# Patient Record
Sex: Male | Born: 1980 | Race: White | Hispanic: No | Marital: Single | Smoking: Current some day smoker
Health system: Southern US, Community
[De-identification: ages and names within clinical notes are randomized; demographics above are authoritative.]

## PROBLEM LIST (undated history)

## (undated) DIAGNOSIS — Z720 Tobacco use: Secondary | ICD-10-CM

## (undated) DIAGNOSIS — L0390 Cellulitis, unspecified: Secondary | ICD-10-CM

## (undated) DIAGNOSIS — F1990 Other psychoactive substance use, unspecified, uncomplicated: Secondary | ICD-10-CM

## (undated) HISTORY — PX: SHOULDER SURGERY: SHX246

---

## 2017-01-26 ENCOUNTER — Emergency Department (HOSPITAL_COMMUNITY): Payer: Self-pay

## 2017-01-26 ENCOUNTER — Encounter (HOSPITAL_COMMUNITY): Payer: Self-pay | Admitting: Emergency Medicine

## 2017-01-26 ENCOUNTER — Inpatient Hospital Stay (HOSPITAL_COMMUNITY)
Admission: EM | Admit: 2017-01-26 | Discharge: 2017-01-27 | DRG: 603 | Discharge status: Against Medical Advice | Payer: Self-pay | Attending: Internal Medicine | Admitting: Internal Medicine

## 2017-01-26 DIAGNOSIS — F1990 Other psychoactive substance use, unspecified, uncomplicated: Secondary | ICD-10-CM | POA: Diagnosis present

## 2017-01-26 DIAGNOSIS — F1910 Other psychoactive substance abuse, uncomplicated: Secondary | ICD-10-CM | POA: Diagnosis present

## 2017-01-26 DIAGNOSIS — F17210 Nicotine dependence, cigarettes, uncomplicated: Secondary | ICD-10-CM | POA: Diagnosis present

## 2017-01-26 DIAGNOSIS — L0390 Cellulitis, unspecified: Secondary | ICD-10-CM | POA: Diagnosis present

## 2017-01-26 DIAGNOSIS — L03012 Cellulitis of left finger: Secondary | ICD-10-CM

## 2017-01-26 DIAGNOSIS — Z720 Tobacco use: Secondary | ICD-10-CM | POA: Diagnosis present

## 2017-01-26 DIAGNOSIS — L03114 Cellulitis of left upper limb: Principal | ICD-10-CM | POA: Diagnosis present

## 2017-01-26 HISTORY — DX: Other psychoactive substance use, unspecified, uncomplicated: F19.90

## 2017-01-26 HISTORY — DX: Tobacco use: Z72.0

## 2017-01-26 HISTORY — DX: Cellulitis, unspecified: L03.90

## 2017-01-26 LAB — BASIC METABOLIC PANEL
Anion gap: 13 (ref 5–15)
BUN: 12 mg/dL (ref 6–20)
CALCIUM: 9.3 mg/dL (ref 8.9–10.3)
CO2: 24 mmol/L (ref 22–32)
CREATININE: 1.01 mg/dL (ref 0.61–1.24)
Chloride: 99 mmol/L — ABNORMAL LOW (ref 101–111)
GFR calc Af Amer: >60 mL/min (ref >60)
GLUCOSE: 84 mg/dL (ref 65–99)
POTASSIUM: 4.1 mmol/L (ref 3.5–5.1)
SODIUM: 136 mmol/L (ref 135–145)

## 2017-01-26 LAB — CBC WITH DIFFERENTIAL/PLATELET
Basophils Absolute: 0 10*3/uL (ref 0.0–0.1)
Basophils Relative: 0 %
EOS ABS: 0 10*3/uL (ref 0.0–0.7)
EOS PCT: 0 %
HCT: 39 % (ref 39.0–52.0)
Hemoglobin: 13.1 g/dL (ref 13.0–17.0)
LYMPHS ABS: 2.1 10*3/uL (ref 0.7–4.0)
Lymphocytes Relative: 32 %
MCH: 29.6 pg (ref 26.0–34.0)
MCHC: 33.6 g/dL (ref 30.0–36.0)
MCV: 88.2 fL (ref 78.0–100.0)
MONO ABS: 0.5 10*3/uL (ref 0.1–1.0)
Monocytes Relative: 7 %
Neutro Abs: 3.9 10*3/uL (ref 1.7–7.7)
Neutrophils Relative %: 61 %
PLATELETS: 153 10*3/uL (ref 150–400)
RBC: 4.42 MIL/uL (ref 4.22–5.81)
RDW: 13.4 % (ref 11.5–15.5)
WBC: 6.5 10*3/uL (ref 4.0–10.5)

## 2017-01-26 LAB — RAPID URINE DRUG SCREEN, HOSP PERFORMED
AMPHETAMINES: POSITIVE — AB
Barbiturates: NOT DETECTED
Benzodiazepines: NOT DETECTED
COCAINE: NOT DETECTED
OPIATES: POSITIVE — AB
Tetrahydrocannabinol: NOT DETECTED

## 2017-01-26 LAB — LACTIC ACID, PLASMA: LACTIC ACID, VENOUS: 1 mmol/L (ref 0.5–1.9)

## 2017-01-26 LAB — HIV ANTIBODY (ROUTINE TESTING W REFLEX): HIV SCREEN 4TH GENERATION: NONREACTIVE

## 2017-01-26 MED ORDER — CEFTRIAXONE SODIUM 1 G IJ SOLR
1.0000 g | INTRAMUSCULAR | Status: DC
  Administered 2017-01-26 – 2017-01-27 (×2): 1 g via INTRAVENOUS
  Filled 2017-01-26 (×3): qty 10

## 2017-01-26 MED ORDER — SODIUM CHLORIDE 0.9 % IV SOLN
INTRAVENOUS | Status: AC
  Administered 2017-01-26: 11:00:00 via INTRAVENOUS

## 2017-01-26 MED ORDER — ACETAMINOPHEN 325 MG PO TABS: 650.0000 mg | ORAL_TABLET | Freq: Four times a day (QID) | ORAL | Status: DC | PRN

## 2017-01-26 MED ORDER — SODIUM CHLORIDE 0.9 % IV BOLUS (SEPSIS)
2000.0000 mL | Freq: Once | INTRAVENOUS | Status: AC
  Administered 2017-01-26: 2000 mL via INTRAVENOUS

## 2017-01-26 MED ORDER — CEFAZOLIN IN D5W 1 GM/50ML IV SOLN: 1.0000 g | Freq: Three times a day (TID) | INTRAVENOUS | Status: DC

## 2017-01-26 MED ORDER — ONDANSETRON HCL 4 MG/2ML IJ SOLN
4.0000 mg | Freq: Once | INTRAMUSCULAR | Status: AC
  Administered 2017-01-26: 4 mg via INTRAVENOUS
  Filled 2017-01-26: qty 2

## 2017-01-26 MED ORDER — NICOTINE 21 MG/24HR TD PT24
21.0000 mg | MEDICATED_PATCH | Freq: Every day | TRANSDERMAL | Status: DC
  Administered 2017-01-26 – 2017-01-27 (×2): 21 mg via TRANSDERMAL
  Filled 2017-01-26 (×2): qty 1

## 2017-01-26 MED ORDER — SENNOSIDES-DOCUSATE SODIUM 8.6-50 MG PO TABS: 1.0000 | ORAL_TABLET | Freq: Every evening | ORAL | Status: DC | PRN

## 2017-01-26 MED ORDER — ONDANSETRON HCL 4 MG PO TABS: 4.0000 mg | ORAL_TABLET | Freq: Four times a day (QID) | ORAL | Status: DC | PRN

## 2017-01-26 MED ORDER — ENOXAPARIN SODIUM 40 MG/0.4ML ~~LOC~~ SOLN
40.0000 mg | Freq: Every day | SUBCUTANEOUS | Status: DC
  Administered 2017-01-26 – 2017-01-27 (×2): 40 mg via SUBCUTANEOUS
  Filled 2017-01-26 (×2): qty 0.4

## 2017-01-26 MED ORDER — OXYCODONE-ACETAMINOPHEN 5-325 MG PO TABS
1.0000 | ORAL_TABLET | ORAL | Status: DC | PRN
  Administered 2017-01-26 – 2017-01-27 (×6): 1 via ORAL
  Filled 2017-01-26 (×6): qty 1

## 2017-01-26 MED ORDER — MORPHINE SULFATE (PF) 4 MG/ML IV SOLN
4.0000 mg | Freq: Once | INTRAVENOUS | Status: AC
  Administered 2017-01-26: 4 mg via INTRAVENOUS
  Filled 2017-01-26: qty 1

## 2017-01-26 MED ORDER — MORPHINE SULFATE (PF) 4 MG/ML IV SOLN
1.0000 mg | INTRAVENOUS | Status: DC | PRN
Start: 2017-01-26 — End: 2017-01-27
  Administered 2017-01-26 – 2017-01-27 (×6): 1 mg via INTRAVENOUS
  Filled 2017-01-26 (×6): qty 1

## 2017-01-26 MED ORDER — SODIUM CHLORIDE 0.9 % IV SOLN: INTRAVENOUS | Status: DC

## 2017-01-26 MED ORDER — VANCOMYCIN HCL IN DEXTROSE 1-5 GM/200ML-% IV SOLN
1000.0000 mg | Freq: Once | INTRAVENOUS | Status: AC
  Administered 2017-01-26: 1000 mg via INTRAVENOUS
  Filled 2017-01-26: qty 200

## 2017-01-26 MED ORDER — ACETAMINOPHEN 650 MG RE SUPP: 650.0000 mg | Freq: Four times a day (QID) | RECTAL | Status: DC | PRN

## 2017-01-26 MED ORDER — ONDANSETRON HCL 4 MG/2ML IJ SOLN: 4.0000 mg | Freq: Four times a day (QID) | INTRAMUSCULAR | Status: DC | PRN

## 2017-01-26 NOTE — Progress Notes (Signed)
Luke Ellis is a 36 y.o. male patient admitted from ED awake, alert - oriented  X 4 - no acute distress noted.  VSS - Blood pressure 126/75, pulse 74, temperature 98.9 F (37.2 C), temperature source Oral, resp. rate 16, height 6' (1.829 m), weight 79.9 kg (176 lb 1.6 oz), SpO2 99 %.    IV in place, occlusive dsg intact without redness.  Orientation to room, and floor completed with information packet given to patient/family.  Patient declined safety video at this time.  Admission INP armband ID verified with patient/family, and in place.   SR up x 2, fall assessment complete, with patient and family able to verbalize understanding of risk associated with falls, and verbalized understanding to call nsg before up out of bed.  Call light within reach, patient able to voice, and demonstrate understanding.  Skin, clean-dry- intact without evidence of bruising, or skin tears.   No evidence of skin break down noted on exam.     Will cont to eval and treat per MD orders.  Luke EastErin M Jojo Geving, RN 01/26/2017 4:24 PM

## 2017-01-26 NOTE — ED Notes (Signed)
IV team at bedside 

## 2017-01-26 NOTE — ED Provider Notes (Signed)
[facility]-EMERGENCY DEPT Provider Note   CSN: [id number] Arrival date & time: 01/26/17  0400     History   Chief Complaint Chief Complaint  Patient presents with  . Hand Pain    HPI Luke Ellis is a 36 y.o. male.  HPI  This is a 36 year old male with a history of IV drug abuse who presents with left hand redness, pain, and swelling. Patient reports that 2-3 days ago he accidentally stuck himself with a used needle in the palmar aspect of the left hand. He states "I hit bone." He denies any needle sharing. Patient states that since that time he has had increasing pain, swelling, and redness to the left hand. He is also noted pain in the left middle digit. Current pain is 10 out of 10. He is not taking anything for pain. Patient states that he was seen at [facility] days ago" I have been on antibiotics but it has gotten worse." He has a prescription for Bactrim. He is right handed. Denies any fevers.  History reviewed. No pertinent past medical history.  There are no active problems to display for this patient.   Past Surgical History:  Procedure Laterality Date  . SHOULDER SURGERY Left        Home Medications    Prior to Admission medications   Medication Sig Start Date End Date Taking? Authorizing Provider  oxyCODONE (OXY IR/ROXICODONE) 5 MG immediate release tablet Take 5 mg by mouth every 6 (six) hours as needed for severe pain.   Yes Historical Provider, MD  sulfamethoxazole-trimethoprim (BACTRIM DS,SEPTRA DS) 800-160 MG tablet Take 1 tablet by mouth 2 (two) times daily.   Yes Historical Provider, MD    Family History History reviewed. No pertinent family history.  Social History Social History  Substance Use Topics  . Smoking status: Current Some Day Smoker    Packs/day: 1.00    Types: Cigarettes  . Smokeless tobacco: Former NeurosurgeonUser  . Alcohol use No     Allergies   Patient has no known allergies.   Review of Systems Review of Systems    Constitutional: Negative for fever.  Respiratory: Negative for shortness of breath.   Cardiovascular: Negative for chest pain.  Musculoskeletal:       Left hand pain, swelling, erythema  Skin: Positive for color change.  All other systems reviewed and are negative.    Physical Exam Updated Vital Signs BP (!) 138/92 (BP Location: Right Arm)   Pulse 76   Temp 97.8 F (36.6 C) (Oral)   Resp 16   Ht 6' (1.829 m)   Wt 180 lb (81.6 kg)   SpO2 100%   BMI 24.41 kg/m   Physical Exam  Constitutional: He is oriented to person, place, and time. He appears well-developed and well-nourished. No distress.  HENT:  Head: Normocephalic and atraumatic.  Cardiovascular: Normal rate, regular rhythm and normal heart sounds.   No murmur heard. Pulmonary/Chest: Effort normal and breath sounds normal. No respiratory distress. He has no wheezes.  Abdominal: Soft. There is no tenderness.  Musculoskeletal: He exhibits no edema.  Diffuse swelling noted to the dorsum of the left hand, mild erythema and warmth, there is tenderness palpation over the palmar aspect along the third metacarpal, swelling noted of the third digit with decreased flexion range of motion, 2+ radial pulse  Neurological: He is alert and oriented to person, place, and time.  Skin: Skin is warm and dry.  Psychiatric: He has a normal mood and affect.  Nursing note and vitals reviewed.    ED Treatments / Results  Labs (all labs ordered are listed, but only abnormal results are displayed) Labs Reviewed  BASIC METABOLIC PANEL - Abnormal; Notable for the following:       Result Value   Chloride 99 (*)    All other components within normal limits  CULTURE, BLOOD (ROUTINE X 2)  CULTURE, BLOOD (ROUTINE X 2)  CBC WITH DIFFERENTIAL/PLATELET    EKG  EKG Interpretation None       Radiology Dg Hand Complete Left  Result Date: 01/26/2017 CLINICAL DATA:  36 year old male stuck with a needle in the left palm presenting with  swelling of the left hand. EXAM: LEFT HAND - COMPLETE 3+ VIEW COMPARISON:  None. FINDINGS: There is no acute fracture or dislocation. The bones are well mineralized. No arthritic changes. There is diffuse soft tissue swelling of the hand and distal forearm. No soft tissue gas or radiopaque foreign object identified. IMPRESSION: No acute/traumatic osseous pathology. Diffuse soft tissue swelling of the hand and distal forearm, likely infectious. No radiopaque foreign object or soft tissue gas. Electronically Signed   By: Elgie Collard M.D.   On: 01/26/2017 05:37    Procedures Procedures (including critical care time)  Medications Ordered in ED Medications  vancomycin (VANCOCIN) IVPB 1000 mg/200 mL premix (not administered)  morphine 4 MG/ML injection 4 mg (not administered)  ondansetron (ZOFRAN) injection 4 mg (not administered)     Initial Impression / Assessment and Plan / ED Course  I have reviewed the triage vital signs and the nursing notes.  Pertinent labs & imaging results that were available during my care of the patient were reviewed by me and considered in my medical decision making (see chart for details).     Patient presents with worsening swelling and pain of the left hand. Appears to be cellulitic. Difficult to assess whether there is underlying abscess or tenosynovitis. Lab work is largely unremarkable. Patient was given IV vancomycin. X-ray showed no evidence of osteomyelitis. Given that he has failed outpatient antibiotics, feel he needs to be admitted for further antibiotics. He may need hand surgery evaluation as well if symptoms do not improve.    Final Clinical Impressions(s) / ED Diagnoses   Final diagnoses:  Cellulitis of left upper extremity    New Prescriptions New Prescriptions   No medications on file     Shon Baton, MD 01/26/17 [phone]

## 2017-01-26 NOTE — Progress Notes (Signed)
Pt requested nicotine patch MD paged

## 2017-01-26 NOTE — ED Notes (Signed)
Pt reports he stabbed himself 2 days ago with a hyperdermic needle in his hand and it went down to the bone. Pt reports he is an IV drug user and uses his own needles.

## 2017-01-26 NOTE — ED Notes (Signed)
Nurse will also get 2nd set blood cultures

## 2017-01-26 NOTE — Progress Notes (Signed)
This is a no charge note  Pending admission per Dr. Wilkie AyeHorton  36 year old male with a history of IV drug abuse who presents with left hand redness, pain 10/10, and swelling. Swelling is mainly on the dorsal side of left hand per EDP. Patient reports that 2-3 days ago he accidentally stuck himself with a used needle in the palmar aspect of the left hand. He states "I hit bone." He denies any needle sharing. Patient states that he was seen at [facility] 2 days ago. He has a prescription for Bactrim, no improvement. WBC 6.5, temperature normal, electrolytes renal function okay, x-ray did not show evidence of osteomyelitis. Patient is accepted to MedSurg bed for observation. May need hand surgeon consultation in morning. Pt was given one dose of vanco in ED. Bx collected. Will check lactic acid. Give 2L NS bolus and 100 cc/h of NS.  Lorretta HarpXilin Kyilee Gregg, MD  [facility] Pager [phone]  If 7PM-7AM, please contact night-coverage [email or web address] Password [email or web address] 01/26/2017, 6:40 AM

## 2017-01-26 NOTE — ED Triage Notes (Signed)
Pt admits to being an opiate drug user and admits to hitting the bone approx 5 days and is not sure if the two are related. PT states his pain began approx 3 days ago. 10/10. Hand is not hot to touch but does feel slightly warm. Hand is swollen. Pt hand is pink. Pt accidentally hit his hand with the needle and believes he hit the bone. Denies actually injecting his hand. Pt able to move pinkie, ring finger, thumb with ease. Pt able to move pointed finger slightly. Pt unable to move middle finger.

## 2017-01-26 NOTE — H&P (Signed)
History and Physical    Luke Ellis GNF:[id number] DOB: 10-29-1981 DOA: 01/26/2017  PCP: No PCP Per Patient Patient coming from: home  Chief Complaint: left hand swelling  HPI: Luke Ellis is a pleasant  36 y.o. male with medical history significant for tobacco use, IV drug use presents to the emergency Department chief complaint swelling in his left hand. Initial evaluation x-ray revealing soft tissue swelling likely infection.  Information is obtained from the patient. He reports through 4 day history of gradual worsening swelling/erythema/pain in his left hand. He reports 2 days ago he accidentally stuck himself with a used needle in the palm of his left hand. He states it felt like "I hit bone". He denies sharing his needles but does admit to IV drug use. He states the last time he used IV drugs was yesterday. 2 days ago he went to [facility] and was given a prescription for Bactrim. He reports compliance with this medication but states the hand continued to swell. He states he has some numbness and tingling of the fingers. He denies headache fever chills nausea vomiting diarrhea. He denies chest pain palpitation shortness of breath.    ED Course: In the emergency department is afebrile hemodynamically stable and not hypoxic. He is provided with vancomycin 2 L of normal saline Zofran and morphine.  Review of Systems: As per HPI otherwise 10 point review of systems negative.   Ambulatory Status: He ambulates independently is independent with ADLs  Past Medical History:  Diagnosis Date  . Cellulitis   . IV drug user   . Tobacco use     Past Surgical History:  Procedure Laterality Date  . SHOULDER SURGERY Left     Social History   Social History  . Marital status: Single    Spouse name: N/A  . Number of children: N/A  . Years of education: N/A   Occupational History  . Not on file.   Social History Main Topics  . Smoking status: Current Some Day Smoker   Packs/day: 1.00    Types: Cigarettes  . Smokeless tobacco: Former Neurosurgeon  . Alcohol use No  . Drug use: Yes    Types: IV  . Sexual activity: Not on file   Other Topics Concern  . Not on file   Social History Narrative  . No narrative on file    No Known Allergies  History reviewed. No pertinent family history. Family medical history reviewed. Parents alive neither with heart disease stroke cancer of any kind. One sister with no history of heart disease stroke or cancer  Prior to Admission medications   Medication Sig Start Date End Date Taking? Authorizing Provider  oxyCODONE (OXY IR/ROXICODONE) 5 MG immediate release tablet Take 5 mg by mouth every 6 (six) hours as needed for severe pain.   Yes Historical Provider, MD  sulfamethoxazole-trimethoprim (BACTRIM DS,SEPTRA DS) 800-160 MG tablet Take 1 tablet by mouth 2 (two) times daily.   Yes Historical Provider, MD    Physical Exam: Vitals:   01/26/17 0409 01/26/17 0430 01/26/17 0639 01/26/17 0700  BP:  138/84 131/84 125/79  Pulse:  69 72 69  Resp:   16   Temp:   97.7 F (36.5 C)   TempSrc:   Oral   SpO2: 100% 100% 100% 100%  Weight: 81.6 kg (180 lb)     Height: 6' (1.829 m)        General:  Appears calm and comfortable No acute distress Eyes:  PERRL, EOMI, normal  lids, iris ENT:  grossly normal hearing, lips & tongue, mucous membranes of his mouth are moist and pink Neck:  no LAD, masses or thyromegaly Cardiovascular:  RRR, no m/r/g. No LE edema.  Respiratory:  BS with faint wheeze bilaterally,  Normal respiratory effort. Abdomen:  soft, ntnd, positive bowel sounds throughout Skin:  no rash or induration seen on limited exam Musculoskeletal:  grossly normal tone BUE/BLE, good ROM, no bony abnormalityhand with swelling from fingers to base of the palm. Erythema and heat decreased range of motion tenderness to palpation  Psychiatric:  grossly normal mood and affect, speech fluent and appropriate,  Neurologic:  CN 2-12  grossly intact, moves all extremities in coordinated fashion, sensation intact  Labs on Admission: I have personally reviewed following labs and imaging studies  CBC:  Recent Labs Lab 01/26/17 0457  WBC 6.5  NEUTROABS 3.9  HGB 13.1  HCT 39.0  MCV 88.2  PLT 153   Basic Metabolic Panel:  Recent Labs Lab 01/26/17 0457  NA 136  K 4.1  CL 99*  CO2 24  GLUCOSE 84  BUN 12  CREATININE 1.01  CALCIUM 9.3   GFR: Estimated Creatinine Clearance: 111 mL/min (by C-G formula based on SCr of 1.01 mg/dL). Liver Function Tests: No results for input(s): AST, ALT, ALKPHOS, BILITOT, PROT, ALBUMIN in the last 168 hours. No results for input(s): LIPASE, AMYLASE in the last 168 hours. No results for input(s): AMMONIA in the last 168 hours. Coagulation Profile: No results for input(s): INR, PROTIME in the last 168 hours. Cardiac Enzymes: No results for input(s): CKTOTAL, CKMB, CKMBINDEX, TROPONINI in the last 168 hours. BNP (last 3 results) No results for input(s): PROBNP in the last 8760 hours. HbA1C: No results for input(s): HGBA1C in the last 72 hours. CBG: No results for input(s): GLUCAP in the last 168 hours. Lipid Profile: No results for input(s): CHOL, HDL, LDLCALC, TRIG, CHOLHDL, LDLDIRECT in the last 72 hours. Thyroid Function Tests: No results for input(s): TSH, T4TOTAL, FREET4, T3FREE, THYROIDAB in the last 72 hours. Anemia Panel: No results for input(s): VITAMINB12, FOLATE, FERRITIN, TIBC, IRON, RETICCTPCT in the last 72 hours. Urine analysis: No results found for: COLORURINE, APPEARANCEUR, LABSPEC, PHURINE, GLUCOSEU, HGBUR, BILIRUBINUR, KETONESUR, PROTEINUR, UROBILINOGEN, NITRITE, LEUKOCYTESUR  Creatinine Clearance: Estimated Creatinine Clearance: 111 mL/min (by C-G formula based on SCr of 1.01 mg/dL).  Sepsis Labs: @LABRCNTIP (procalcitonin:4,lacticidven:4) )No results found for this or any previous visit (from the past 240 hour(s)).   Radiological Exams on  Admission: Dg Hand Complete Left  Result Date: 01/26/2017 CLINICAL DATA:  36 year old male stuck with a needle in the left palm presenting with swelling of the left hand. EXAM: LEFT HAND - COMPLETE 3+ VIEW COMPARISON:  None. FINDINGS: There is no acute fracture or dislocation. The bones are well mineralized. No arthritic changes. There is diffuse soft tissue swelling of the hand and distal forearm. No soft tissue gas or radiopaque foreign object identified. IMPRESSION: No acute/traumatic osseous pathology. Diffuse soft tissue swelling of the hand and distal forearm, likely infectious. No radiopaque foreign object or soft tissue gas. Electronically Signed   By: Elgie CollardArash  Radparvar M.D.   On: 01/26/2017 05:37    EKG:   Assessment/Plan Principal Problem:   Cellulitis of left hand Active Problems:   Tobacco use   IV drug user   #1. Cellulitis of left hand results of accident all needlestick. Has had 2 days Bactrim OP. Plain x-ray shows no acute/traumatic osseous pathology. Diffuse soft tissue swelling of the hand and  distal forearm likely infectious. No soft tissue gas. He is afebrile nontoxic appearing no leukocytosis lactic acid is pending. He was given take vancomycin in the emergency department as well as IV fluids -IV Rocephin -Follow blood cultures -follow lactic acid -continue IV fluids for now -Supportive therapy -If no improvement consider hand surgery consult  #2. IV drug user. Last used 01/26/16.  -social work consult  #3. Tobacco use.  -cessation counseling offered -nicoderm    DVT prophylaxis: lovenox Code Status: full  Family Communication: none present  Disposition Plan: home hopefully tomorrow  Consults called: none Admission status: obs    Toya Smothers M MD [facility]  If 7PM-7AM, please contact night-coverage [email or web address] Password [facility]  01/26/2017, 7:38 AM

## 2017-01-26 NOTE — ED Notes (Signed)
Nurse starting IV and getting labs 

## 2017-01-27 ENCOUNTER — Inpatient Hospital Stay (HOSPITAL_COMMUNITY): Payer: Self-pay

## 2017-01-27 LAB — CBC
HCT: 37 % — ABNORMAL LOW (ref 39.0–52.0)
Hemoglobin: 12.5 g/dL — ABNORMAL LOW (ref 13.0–17.0)
MCH: 29.7 pg (ref 26.0–34.0)
MCHC: 33.8 g/dL (ref 30.0–36.0)
MCV: 87.9 fL (ref 78.0–100.0)
PLATELETS: 227 10*3/uL (ref 150–400)
RBC: 4.21 MIL/uL — AB (ref 4.22–5.81)
RDW: 13.7 % (ref 11.5–15.5)
WBC: 5.9 10*3/uL (ref 4.0–10.5)

## 2017-01-27 MED ORDER — IOPAMIDOL (ISOVUE-300) INJECTION 61%
INTRAVENOUS | Status: AC
  Administered 2017-01-27: 75 mL
  Filled 2017-01-27: qty 75

## 2017-01-27 NOTE — Progress Notes (Signed)
Patient comtemplating leaving AMA. Thought he would stay. Looked up and saw him hurriedly leaving with 4 people. Called out his name two times never turned back to acknolwedge. Caught him in the stairwell and told him I had to remove his IV before he left. He said "Oh yeah" and then dropped his bags in the stairwell and came back to the room with me where I removed his IV.

## 2017-01-27 NOTE — Discharge Summary (Signed)
AMA  Patient at this time expresses desire to leave the Hospital immidiately, patient has been warned that this is not Medically advisable at this time, and can result in Medical complications like Death and Disability, patient understands and accepts the risks involved and assumes full responsibilty of this decision.   Leroy Sea M.D on 01/27/2017 at 4:51 PM  [facility]  Time < 30 minutes  Last Note Below                                                                     PROGRESS NOTE                                                                                                                                                                                                             Patient Demographics:    Denim Kalmbach, is a 36 y.o. male, DOB - 1981-03-19, ZOX:[id number]  Admit date - 01/26/2017   Admitting Physician Ozella Rocks, MD  Outpatient Primary MD for the patient is No PCP Per Patient  LOS - 1  Chief Complaint  Patient presents with  . Hand Pain       Brief Narrative  Lakendrick Paradis is a pleasant  36 y.o. male with medical history significant for tobacco use, IV drug use presents to the emergency Department chief complaint swelling in his left hand. Initial evaluation x-ray revealing soft tissue swelling likely infection   Subjective:    Loralee Pacas today has, No headache, No chest pain, No abdominal pain - No Nausea, No new weakness tingling or numbness, No Cough - SOB. Mild left hand pain and swelling.   Assessment  & Plan :     1.Left hand cellulitis. History of IV drug use. Continue vancomycin along with Rocephin. Follow cultures, on exam no fluctuance noted but have ordered left hand CT scan with contrast. We will monitor. Requested to keep left hand elevated.  2. Smoking and IV drug use.  Medical patch, counseled to quit all. We'll check hep C and HIV.    Diet : Diet regular Room service appropriate? Yes; Fluid consistency: Thin    Family Communication  :  None  Code Status : Full  Disposition Plan  :  TBD  Consults  : None  Procedures  :  CT L Hand ordered  DVT Prophylaxis  :  Lovenox    Lab Results  Component Value Date   PLT 227 01/27/2017    Inpatient Medications  Scheduled Meds: . cefTRIAXone (ROCEPHIN)  IV  1 g Intravenous Q24H  . enoxaparin (LOVENOX) injection  40 mg Subcutaneous Daily  . nicotine  21 mg Transdermal Daily   Continuous Infusions: PRN Meds:.acetaminophen **OR** acetaminophen, morphine injection, ondansetron **OR** ondansetron (ZOFRAN) IV, oxyCODONE-acetaminophen, senna-docusate  Antibiotics  :    Anti-infectives    Start     Dose/Rate Route Frequency Ordered Stop   01/26/17 0800  cefTRIAXone (ROCEPHIN) 1 g in dextrose 5 % 50 mL IVPB     1 g 100 mL/hr over 30 Minutes Intravenous Every 24 hours 01/26/17 0738     01/26/17 0730  ceFAZolin (ANCEF) IVPB 1 g/50 mL premix  Status:  Discontinued     1 g 100 mL/hr over 30 Minutes Intravenous Every 8 hours 01/26/17 0721 01/26/17 0734   01/26/17 0445  vancomycin (VANCOCIN) IVPB 1000 mg/200 mL premix     1,000 mg 200 mL/hr over 60 Minutes Intravenous  Once 01/26/17 0437 01/26/17 0742         Objective:   Vitals:   01/26/17 2155 01/27/17 0536 01/27/17 0536 01/27/17 1411  BP: 137/85 112/65  (!) 143/78  Pulse: 88 80  83  Resp: 18 18  16   Temp: 99 F (37.2 C)  98.6 F (37 C) 98.1 F (36.7 C)  TempSrc:    Oral  SpO2: 100% 97%  100%  Weight:      Height:        Wt Readings from Last 3 Encounters:  01/26/17 79.9 kg (176 lb 1.6 oz)     Intake/Output Summary (Last 24 hours) at 01/27/17 1651 Last data filed at 01/26/17 2023  Gross per 24 hour  Intake              150 ml  Output                0 ml  Net              150 ml     Physical Exam  Awake Alert, Oriented X 3, No  new F.N deficits, Normal affect [place].AT,PERRAL Supple Neck,No JVD, No cervical lymphadenopathy appriciated.  Symmetrical Chest wall movement, Good air movement bilaterally, CTAB RRR,No Gallops,Rubs or new Murmurs, No Parasternal Heave +ve B.Sounds, Abd Soft, No tenderness, No organomegaly appriciated, No rebound - guarding or rigidity. No Cyanosis, Clubbing or edema, No new Rash or bruise , L hand mildly swollen    Data Review:    CBC  Recent Labs Lab 01/26/17 0457 01/27/17 0529  WBC 6.5 5.9  HGB 13.1 12.5*  HCT 39.0 37.0*  PLT 153 227  MCV 88.2 87.9  MCH 29.6 29.7  MCHC 33.6 33.8  RDW 13.4 13.7  LYMPHSABS 2.1  --   MONOABS 0.5  --   EOSABS 0.0  --   BASOSABS 0.0  --     Chemistries   Recent Labs Lab 01/26/17 0457  NA 136  K 4.1  CL 99*  CO2 24  GLUCOSE 84  BUN 12  CREATININE 1.01  CALCIUM 9.3   ------------------------------------------------------------------------------------------------------------------ No results for input(s): CHOL, HDL, LDLCALC, TRIG, CHOLHDL, LDLDIRECT in the last 72 hours.  No results found for: HGBA1C ------------------------------------------------------------------------------------------------------------------ No results for input(s): TSH, T4TOTAL, T3FREE, THYROIDAB in the last 72 hours.  Invalid input(s): FREET3 ------------------------------------------------------------------------------------------------------------------ No results for input(s): VITAMINB12, FOLATE, FERRITIN, TIBC, IRON, RETICCTPCT in the last 72 hours.  Coagulation profile No results for input(s): INR, PROTIME in the last 168 hours.  No results for input(s): DDIMER in the last 72 hours.  Cardiac Enzymes No results for input(s): CKMB, TROPONINI, MYOGLOBIN in the last 168 hours.  Invalid input(s): CK ------------------------------------------------------------------------------------------------------------------ No results found for: BNP  Micro  Results Recent Results (from the past 240 hour(s))  Blood culture (routine x 2)     Status: None (Preliminary result)   Collection Time: 01/26/17  4:53 AM  Result Value Ref Range Status   Specimen Description BLOOD RIGHT ARM  Final   Special Requests IN PEDIATRIC BOTTLE  Final   Culture NO GROWTH 1 DAY  Final   Report Status PENDING  Incomplete  Blood culture (routine x 2)     Status: None (Preliminary result)   Collection Time: 01/26/17  5:45 AM  Result Value Ref Range Status   Specimen Description BLOOD RIGHT ARM UPPER  Final   Special Requests BOTTLES DRAWN AEROBIC AND ANAEROBIC  UNKNOWN  Final   Culture NO GROWTH 1 DAY  Final   Report Status PENDING  Incomplete    Radiology Reports Ct Hand Left W Contrast  Result Date: 01/27/2017 CLINICAL DATA:  Cellulitis of the left hand. Soft tissue swelling of the hand and distal forearm. EXAM: CT OF THE LEFT HAND WITH CONTRAST TECHNIQUE: Multidetector CT imaging of the left hand was performed according to the standard protocol. Multiplanar CT image reconstructions were also generated. COMPARISON:  None. CONTRAST:  75 mL Isovue 300 FINDINGS: Bones/Joint/Cartilage No fracture or dislocation. Normal alignment. No joint effusion. Ligaments Ligaments are suboptimally evaluated by CT. Muscles and Tendons Muscles are normal. Flexor and extensor compartment tendons are grossly intact. Soft tissue No fluid collection or hematoma. No soft tissue mass. Soft tissue edema in the subcutaneous fat along the dorsal aspect of the hand extending into the fingers most concerning for cellulitis. No focal fluid collection to suggest an abscess. IMPRESSION: Soft tissue edema in the subcutaneous fat along the dorsal aspect of the hand extending into the fingers most concerning for cellulitis. No focal fluid collection to suggest an abscess. Electronically Signed   By: Elige Ko   On: 01/27/2017 13:04   Dg Hand Complete Left  Result Date: 01/26/2017 CLINICAL DATA:   36 year old male stuck with a needle in the left palm presenting with swelling of the left hand. EXAM: LEFT HAND - COMPLETE 3+ VIEW COMPARISON:  None. FINDINGS: There is no acute fracture or dislocation. The bones are well mineralized. No arthritic changes. There is diffuse soft tissue swelling of the hand and distal forearm. No soft tissue gas or radiopaque foreign object identified. IMPRESSION: No acute/traumatic osseous pathology. Diffuse soft tissue swelling of the hand and distal forearm, likely infectious. No radiopaque foreign object or soft tissue gas. Electronically Signed   By: Elgie Collard M.D.   On: 01/26/2017 05:37    Time Spent in minutes  30   SINGH,PRASHANT K M.D on 01/27/2017 at 4:51 PM  Between 7am to 7pm - Pager - [phone] ( page via [facility], text pages only, please  mention full 10 digit call back number).  After 7pm go to [email or web address] - password [email or web address]  [facility] -  Office  [phone]

## 2017-01-27 NOTE — Progress Notes (Addendum)
PROGRESS NOTE                                                                                                                                                                                                             Patient Demographics:    Luke Ellis, is a 36 y.o. male, DOB - [birth year], ZOX:[id number]  Admit date - 01/26/2017   Admitting Physician Ozella Rocks, MD  Outpatient Primary MD for the patient is No PCP Per Patient  LOS - 1  Chief Complaint  Patient presents with  . Hand Pain       Brief Narrative  Keir Viernes is a pleasant  36 y.o. male with medical history significant for tobacco use, IV drug use presents to the emergency Department chief complaint swelling in his left hand. Initial evaluation x-ray revealing soft tissue swelling likely infection   Subjective:    Loralee Pacas today has, No headache, No chest pain, No abdominal pain - No Nausea, No new weakness tingling or numbness, No Cough - SOB. Mild left hand pain and swelling.   Assessment  & Plan :     1.Left hand cellulitis. History of IV drug use. Continue vancomycin along with Rocephin. Follow cultures, on exam no fluctuance noted but have ordered left hand CT scan with contrast. We will monitor. Requested to keep left hand elevated.  2. Smoking and IV drug use. Medical patch, counseled to quit all. We'll check hep C and HIV.    Diet : Diet regular Room service appropriate? Yes; Fluid consistency: Thin    Family Communication  :  None  Code Status : Full  Disposition Plan  :  TBD  Consults  : None  Procedures  :  CT L Hand ordered  DVT Prophylaxis  :  Lovenox    Lab Results  Component Value Date   PLT 227 01/27/2017    Inpatient Medications  Scheduled Meds: . cefTRIAXone (ROCEPHIN)  IV  1 g Intravenous Q24H  . enoxaparin (LOVENOX) injection  40 mg Subcutaneous Daily  . nicotine  21 mg Transdermal Daily    Continuous Infusions: PRN Meds:.acetaminophen **OR** acetaminophen, morphine injection, ondansetron **OR** ondansetron (ZOFRAN) IV, oxyCODONE-acetaminophen, senna-docusate  Antibiotics  :    Anti-infectives    Start     Dose/Rate Route Frequency Ordered Stop   01/26/17 0800  cefTRIAXone (ROCEPHIN) 1 g in dextrose 5 % 50 mL IVPB     1 g 100 mL/hr over 30 Minutes Intravenous Every 24 hours 01/26/17 0738     01/26/17 0730  ceFAZolin (ANCEF) IVPB 1 g/50 mL premix  Status:  Discontinued     1 g 100 mL/hr over 30 Minutes Intravenous Every 8 hours 01/26/17 0721 01/26/17 0734   01/26/17 0445  vancomycin (VANCOCIN) IVPB 1000 mg/200 mL premix     1,000 mg 200 mL/hr over 60 Minutes Intravenous  Once 01/26/17 0437 01/26/17 0742         Objective:   Vitals:   01/26/17 1412 01/26/17 2155 01/27/17 0536 01/27/17 0536  BP: 126/75 137/85 112/65   Pulse: 74 88 80   Resp: 16 18 18    Temp: 98.9 F (37.2 C) 99 F (37.2 C)  98.6 F (37 C)  TempSrc: Oral     SpO2: 99% 100% 97%   Weight:      Height:        Wt Readings from Last 3 Encounters:  01/26/17 79.9 kg (176 lb 1.6 oz)     Intake/Output Summary (Last 24 hours) at 01/27/17 1221 Last data filed at 01/26/17 2023  Gross per 24 hour  Intake           631.67 ml  Output              800 ml  Net          -168.33 ml     Physical Exam  Awake Alert, Oriented X 3, No new F.N deficits, Normal affect [place].AT,PERRAL Supple Neck,No JVD, No cervical lymphadenopathy appriciated.  Symmetrical Chest wall movement, Good air movement bilaterally, CTAB RRR,No Gallops,Rubs or new Murmurs, No Parasternal Heave +ve B.Sounds, Abd Soft, No tenderness, No organomegaly appriciated, No rebound - guarding or rigidity. No Cyanosis, Clubbing or edema, No new Rash or bruise , L hand mildly swollen    Data Review:    CBC  Recent Labs Lab 01/26/17 0457 01/27/17 0529  WBC 6.5 5.9  HGB 13.1 12.5*  HCT 39.0 37.0*  PLT 153 227  MCV 88.2 87.9  MCH  29.6 29.7  MCHC 33.6 33.8  RDW 13.4 13.7  LYMPHSABS 2.1  --   MONOABS 0.5  --   EOSABS 0.0  --   BASOSABS 0.0  --     Chemistries   Recent Labs Lab 01/26/17 0457  NA 136  K 4.1  CL 99*  CO2 24  GLUCOSE 84  BUN 12  CREATININE 1.01  CALCIUM 9.3   ------------------------------------------------------------------------------------------------------------------ No results for input(s): CHOL, HDL, LDLCALC, TRIG, CHOLHDL, LDLDIRECT in the last 72 hours.  No results found for: HGBA1C ------------------------------------------------------------------------------------------------------------------ No results for input(s): TSH, T4TOTAL, T3FREE, THYROIDAB in the last 72 hours.  Invalid input(s): FREET3 ------------------------------------------------------------------------------------------------------------------ No results for input(s): VITAMINB12, FOLATE, FERRITIN, TIBC, IRON, RETICCTPCT in the last 72 hours.  Coagulation profile No results for input(s): INR, PROTIME in the last 168 hours.  No results for input(s): DDIMER in the last 72 hours.  Cardiac Enzymes No results for input(s): CKMB, TROPONINI, MYOGLOBIN in the last 168 hours.  Invalid input(s): CK ------------------------------------------------------------------------------------------------------------------ No results found for: BNP  Micro Results No results found for this or any previous visit (from the past 240 hour(s)).  Radiology Reports Dg Hand Complete Left  Result Date: 01/26/2017 CLINICAL DATA:  36 year old male stuck with a needle in the left palm presenting with swelling of the left hand. EXAM: LEFT HAND -  COMPLETE 3+ VIEW COMPARISON:  None. FINDINGS: There is no acute fracture or dislocation. The bones are well mineralized. No arthritic changes. There is diffuse soft tissue swelling of the hand and distal forearm. No soft tissue gas or radiopaque foreign object identified. IMPRESSION: No  acute/traumatic osseous pathology. Diffuse soft tissue swelling of the hand and distal forearm, likely infectious. No radiopaque foreign object or soft tissue gas. Electronically Signed   By: Elgie CollardArash  Radparvar M.D.   On: 01/26/2017 05:37    Time Spent in minutes  30   Atom Solivan K M.D on 01/27/2017 at 12:21 PM  Between 7am to 7pm - Pager - [phone] ( page via [facility], text pages only, please mention full 10 digit call back number).  After 7pm go to [email or web address] - password [email or web address]  [facility] -  Office  [phone]

## 2017-01-28 LAB — HEPATITIS PANEL, ACUTE
HCV Ab: >11 s/co ratio — ABNORMAL HIGH (ref 0.0–0.9)
HEP B C IGM: NEGATIVE
Hep A IgM: NEGATIVE
Hepatitis B Surface Ag: NEGATIVE

## 2017-01-28 LAB — HIV ANTIBODY (ROUTINE TESTING W REFLEX): HIV SCREEN 4TH GENERATION: NONREACTIVE

## 2017-01-31 LAB — CULTURE, BLOOD (ROUTINE X 2)
CULTURE: NO GROWTH
Culture: NO GROWTH

## 2020-04-02 ENCOUNTER — Encounter (HOSPITAL_COMMUNITY): Payer: Self-pay | Admitting: *Deleted

## 2020-04-02 ENCOUNTER — Emergency Department (HOSPITAL_COMMUNITY)
Admission: EM | Admit: 2020-04-02 | Discharge: 2020-04-02 | Disposition: A | Discharge status: Home / Self Care | Payer: Self-pay | Attending: Emergency Medicine | Admitting: Emergency Medicine

## 2020-04-02 ENCOUNTER — Other Ambulatory Visit: Payer: Self-pay

## 2020-04-02 DIAGNOSIS — M545 Low back pain: Secondary | ICD-10-CM | POA: Insufficient documentation

## 2020-04-02 DIAGNOSIS — F17210 Nicotine dependence, cigarettes, uncomplicated: Secondary | ICD-10-CM | POA: Insufficient documentation

## 2020-04-02 DIAGNOSIS — M5442 Lumbago with sciatica, left side: Secondary | ICD-10-CM

## 2020-04-02 DIAGNOSIS — M5432 Sciatica, left side: Secondary | ICD-10-CM | POA: Insufficient documentation

## 2020-04-02 MED ORDER — NAPROXEN 500 MG PO TABS: 500.0000 mg | ORAL_TABLET | Freq: Two times a day (BID) | ORAL | 0 refills | Status: DC

## 2020-04-02 MED ORDER — LIDOCAINE 5 % EX PTCH
1.0000 | MEDICATED_PATCH | CUTANEOUS | Status: DC
  Administered 2020-04-02: 1 via TRANSDERMAL
  Filled 2020-04-02: qty 1

## 2020-04-02 MED ORDER — LIDOCAINE 5 % EX PTCH: 1.0000 | MEDICATED_PATCH | CUTANEOUS | 0 refills | Status: DC

## 2020-04-02 MED ORDER — KETOROLAC TROMETHAMINE 30 MG/ML IJ SOLN
30.0000 mg | Freq: Once | INTRAMUSCULAR | Status: AC
  Administered 2020-04-02: 30 mg via INTRAMUSCULAR
  Filled 2020-04-02: qty 1

## 2020-04-02 NOTE — ED Provider Notes (Signed)
[facility] EMERGENCY DEPARTMENT Provider Note   CSN: [id number] Arrival date & time: 04/02/20  0028    History Back Pain  Luke Ellis is a 39 y.o. male with no significant past medical history who presents for evaluation of back pain.  Patient with intermittent back pain.  Typically occurs after he works for a long stretch of time.  He does outdoor work and lifts heavy objects.  Pain will radiate into his left buttocks into his posterior aspect of his leg.  Recent trauma or injuries.  Denies fever, chills, nausea, vomiting, chest pain, shortness of breath abdominal pain, diarrhea, dysuria.  He denies any IV drug use, bowel or bladder incontinence, saddle paresthesia.  Rates his pain a 7/10.  Denies additional aggravating or alleviating factors.  Per chart review patient does have history of IV drug use.  When asked about this he is adamant that he does not use IV drugs.  He was previously hospitalized in 2018 for hand infection likely thought due to IV drugs  History obtained from patient and past medical records.  No interpreter is used.  HPI     Past Medical History:  Diagnosis Date  . Cellulitis   . IV drug user   . Tobacco use     Patient Active Problem List   Diagnosis Date Noted  . Cellulitis of left hand 01/26/2017  . Cellulitis 01/26/2017  . Tobacco use   . IV drug user     Past Surgical History:  Procedure Laterality Date  . SHOULDER SURGERY Left        No family history on file.  Social History   Tobacco Use  . Smoking status: Current Some Day Smoker    Packs/day: 1.00    Types: Cigarettes  . Smokeless tobacco: Former Network engineer Use Topics  . Alcohol use: No  . Drug use: Yes    Types: IV    Home Medications Prior to Admission medications   Medication Sig Start Date End Date Taking? Authorizing Provider  lidocaine (LIDODERM) 5 % Place 1 patch onto the skin daily. Remove & Discard patch within 12 hours or as directed by MD  04/02/20   Jayveion Stalling A, PA-C  naproxen (NAPROSYN) 500 MG tablet Take 1 tablet (500 mg total) by mouth 2 (two) times daily. 04/02/20   Milah Recht A, PA-C  oxyCODONE (OXY IR/ROXICODONE) 5 MG immediate release tablet Take 5 mg by mouth every 6 (six) hours as needed for severe pain.    [provider]  sulfamethoxazole-trimethoprim (BACTRIM DS,SEPTRA DS) 800-160 MG tablet Take 1 tablet by mouth 2 (two) times daily.    [provider]    Allergies    Patient has no known allergies.  Review of Systems   Review of Systems  Constitutional: Negative.   HENT: Negative.   Respiratory: Negative.   Cardiovascular: Negative.   Gastrointestinal: Negative.   Genitourinary: Negative.   Musculoskeletal: Positive for back pain. Negative for arthralgias, gait problem, joint swelling, myalgias, neck pain and neck stiffness.  Skin: Negative.   Neurological: Negative.   All other systems reviewed and are negative.  Physical Exam Updated Vital Signs BP 128/81 (BP Location: Right Arm)   Pulse 70   Temp 97.8 F (36.6 C) (Oral)   Resp 16   SpO2 99%   Physical Exam Physical Exam  Constitutional: Pt appears well-developed and well-nourished. No distress.  HENT:  Head: Normocephalic and atraumatic.  Mouth/Throat: Oropharynx is clear and moist. No  oropharyngeal exudate.  Eyes: Conjunctivae are normal.  Neck: Normal range of motion. Neck supple.  Full ROM without pain  Cardiovascular: Normal rate, regular rhythm and intact distal pulses.   Pulmonary/Chest: Effort normal and breath sounds normal. No respiratory distress. Pt has no wheezes.  Abdominal: Soft. Pt exhibits no distension. There is no tenderness, rebound or guarding. No abd bruit or pulsatile mass Musculoskeletal:  Full range of motion of the T-spine and L-spine with flexion, hyperextension, and lateral flexion. No midline tenderness or stepoffs. No tenderness to palpation of the spinous processes of the T-spine or  L-spine. Mild tenderness to palpation of the paraspinous muscles of the L-spine. Positive straight leg raise on left at 40'. Tenderness to left piriformis. Lymphadenopathy:    Pt has no cervical adenopathy.  Neurological: Pt is alert. Pt has normal reflexes.  Reflex Scores:      Bicep reflexes are 2+ on the right side and 2+ on the left side.      Brachioradialis reflexes are 2+ on the right side and 2+ on the left side.      Patellar reflexes are 2+ on the right side and 2+ on the left side.      Achilles reflexes are 2+ on the right side and 2+ on the left side. Speech is clear and goal oriented, follows commands Normal 5/5 strength in upper and lower extremities bilaterally including dorsiflexion and plantar flexion, strong and equal grip strength Sensation normal to light and sharp touch Moves extremities without ataxia, coordination intact Normal gait Normal balance No Clonus Skin: Skin is warm and dry. No rash noted or lesions noted. Pt is not diaphoretic. No erythema, ecchymosis,edema or warmth.  Psychiatric: Pt has a normal mood and affect. Behavior is normal.  Nursing note and vitals reviewed. ED Results / Procedures / Treatments   Labs (all labs ordered are listed, but only abnormal results are displayed) Labs Reviewed - No data to display  EKG None  Radiology No results found.  Procedures Procedures (including critical care time)  Medications Ordered in ED Medications  lidocaine (LIDODERM) 5 % 1 patch (1 patch Transdermal Patch Applied 04/02/20 0303)  ketorolac (TORADOL) 30 MG/ML injection 30 mg (30 mg Intramuscular Given 04/02/20 0305)   ED Course  I have reviewed the triage vital signs and the nursing notes.  Pertinent labs & imaging results that were available during my care of the patient were reviewed by me and considered in my medical decision making (see chart for details).  39 year old male presents for evaluation of intermittent back pain x1 month.  Afebrile, non septic, non ill appearing.  Does manual labor for work.  States his pain occurs after he does long stretches at work.  Positive straight leg raise on left.  Tenderness to his left piriformis. No overlying skin changes. Denies recent injuries or falls. No bowel or bladder incontinence, saddle paresthesias.  He denies history of IV drug use however per chart review patient admitted to IV drug use in 2018. I asked patient about this and he adamantly denies use.  He has no systemic symptoms.  Discussed with patient concerns for possible IV drug use in setting of back pain.  Discussed labs and MRI.  Patient is adamant that he does not want any labs and MRIs and prefers symptomatic treatment and to return if his symptoms worsen.  Discussed cannot rule out epidural abscess, osteomyelitis, deep space infection.  Patient voiced understanding of this and continues to decline further work up in  ED.  Will treat symptomatically.  Discussed follow-up outpatient he can return for new or worsening symptoms.  The patient has been appropriately medically screened and/or stabilized in the ED. I have low suspicion for any other emergent medical condition which would require further screening, evaluation or treatment in the ED or require inpatient management.  Patient is hemodynamically stable and in no acute distress.  Patient able to ambulate in department prior to ED.  Evaluation does not show acute pathology that would require ongoing or additional emergent interventions while in the emergency department or further inpatient treatment.  I have discussed the diagnosis with the patient and answered all questions.  Pain is been managed while in the emergency department and patient has no further complaints prior to discharge.  Patient is comfortable with plan discussed in room and is stable for discharge at this time.  I have discussed strict return precautions for returning to the emergency department.  Patient was  encouraged to follow-up with PCP/specialist refer to at discharge.    MDM Rules/Calculators/A&P                       Final Clinical Impression(s) / ED Diagnoses Final diagnoses:  Acute left-sided low back pain with left-sided sciatica    Rx / DC Orders ED Discharge Orders         Ordered    lidocaine (LIDODERM) 5 %  Every 24 hours     04/02/20 0338    naproxen (NAPROSYN) 500 MG tablet  2 times daily     04/02/20 0338           Genessa Beman A, PA-C 04/02/20 0615    Shon Baton, MD 04/02/20 [phone]

## 2020-04-02 NOTE — ED Triage Notes (Signed)
Left lower back pain, radiates through the left buttocks, down the leg area for about a month. Denies specific injury.

## 2020-04-02 NOTE — ED Notes (Signed)
Discharge instructions reviewed with pt. Pt verbalized understanding.   

## 2020-04-02 NOTE — Discharge Instructions (Addendum)
Return for new or worsening symptoms

## 2020-04-02 NOTE — ED Notes (Signed)
Pt verbalized he thinks he has sciatica.  One touch pt, see provider's assessment.

## 2020-04-10 ENCOUNTER — Emergency Department (HOSPITAL_COMMUNITY): Payer: Self-pay

## 2020-04-10 ENCOUNTER — Emergency Department (HOSPITAL_COMMUNITY)
Admission: EM | Admit: 2020-04-10 | Discharge: 2020-04-10 | Disposition: A | Discharge status: Home / Self Care | Payer: Self-pay | Attending: Emergency Medicine | Admitting: Emergency Medicine

## 2020-04-10 ENCOUNTER — Other Ambulatory Visit: Payer: Self-pay

## 2020-04-10 ENCOUNTER — Encounter (HOSPITAL_COMMUNITY): Payer: Self-pay | Admitting: Emergency Medicine

## 2020-04-10 DIAGNOSIS — M545 Low back pain, unspecified: Secondary | ICD-10-CM

## 2020-04-10 DIAGNOSIS — G8929 Other chronic pain: Secondary | ICD-10-CM | POA: Insufficient documentation

## 2020-04-10 DIAGNOSIS — L0291 Cutaneous abscess, unspecified: Secondary | ICD-10-CM

## 2020-04-10 DIAGNOSIS — F17210 Nicotine dependence, cigarettes, uncomplicated: Secondary | ICD-10-CM | POA: Insufficient documentation

## 2020-04-10 DIAGNOSIS — L02413 Cutaneous abscess of right upper limb: Secondary | ICD-10-CM | POA: Insufficient documentation

## 2020-04-10 DIAGNOSIS — L03113 Cellulitis of right upper limb: Secondary | ICD-10-CM | POA: Insufficient documentation

## 2020-04-10 LAB — CBC WITH DIFFERENTIAL/PLATELET
Abs Immature Granulocytes: 0.03 10*3/uL (ref 0.00–0.07)
Basophils Absolute: 0 10*3/uL (ref 0.0–0.1)
Basophils Relative: 0 %
Eosinophils Absolute: 0 10*3/uL (ref 0.0–0.5)
Eosinophils Relative: 0 %
HCT: 38.2 % — ABNORMAL LOW (ref 39.0–52.0)
Hemoglobin: 12.9 g/dL — ABNORMAL LOW (ref 13.0–17.0)
Immature Granulocytes: 0 %
Lymphocytes Relative: 14 %
Lymphs Abs: 1.5 10*3/uL (ref 0.7–4.0)
MCH: 30.4 pg (ref 26.0–34.0)
MCHC: 33.8 g/dL (ref 30.0–36.0)
MCV: 90.1 fL (ref 80.0–100.0)
Monocytes Absolute: 1 10*3/uL (ref 0.1–1.0)
Monocytes Relative: 10 %
Neutro Abs: 7.7 10*3/uL (ref 1.7–7.7)
Neutrophils Relative %: 76 %
Platelets: 240 10*3/uL (ref 150–400)
RBC: 4.24 MIL/uL (ref 4.22–5.81)
RDW: 11.9 % (ref 11.5–15.5)
WBC: 10.3 10*3/uL (ref 4.0–10.5)
nRBC: 0 % (ref 0.0–0.2)

## 2020-04-10 LAB — COMPREHENSIVE METABOLIC PANEL
ALT: 15 U/L (ref 0–44)
AST: 20 U/L (ref 15–41)
Albumin: 3.7 g/dL (ref 3.5–5.0)
Alkaline Phosphatase: 61 U/L (ref 38–126)
Anion gap: 12 (ref 5–15)
BUN: 17 mg/dL (ref 6–20)
CO2: 26 mmol/L (ref 22–32)
Calcium: 9.3 mg/dL (ref 8.9–10.3)
Chloride: 97 mmol/L — ABNORMAL LOW (ref 98–111)
Creatinine, Ser: 0.9 mg/dL (ref 0.61–1.24)
GFR calc Af Amer: >60 mL/min (ref >60)
GFR calc non Af Amer: >60 mL/min (ref >60)
Glucose, Bld: 115 mg/dL — ABNORMAL HIGH (ref 70–99)
Potassium: 4.9 mmol/L (ref 3.5–5.1)
Sodium: 135 mmol/L (ref 135–145)
Total Bilirubin: 0.5 mg/dL (ref 0.3–1.2)
Total Protein: 7.4 g/dL (ref 6.5–8.1)

## 2020-04-10 MED ORDER — CYCLOBENZAPRINE HCL 10 MG PO TABS
10.0000 mg | ORAL_TABLET | Freq: Two times a day (BID) | ORAL | 0 refills | Status: AC | PRN
Start: 2020-04-10 — End: 2020-04-17

## 2020-04-10 MED ORDER — PREDNISONE 20 MG PO TABS
60.0000 mg | ORAL_TABLET | Freq: Once | ORAL | Status: AC
  Administered 2020-04-10: 60 mg via ORAL
  Filled 2020-04-10: qty 3

## 2020-04-10 MED ORDER — VANCOMYCIN HCL 1500 MG/300ML IV SOLN
1500.0000 mg | Freq: Once | INTRAVENOUS | Status: AC
  Administered 2020-04-10: 1500 mg via INTRAVENOUS

## 2020-04-10 MED ORDER — PREDNISONE 10 MG PO TABS
40.0000 mg | ORAL_TABLET | Freq: Every day | ORAL | 0 refills | Status: AC
Start: 2020-04-10 — End: 2020-04-15

## 2020-04-10 MED ORDER — LIDOCAINE-EPINEPHRINE (PF) 2 %-1:200000 IJ SOLN
10.0000 mL | Freq: Once | INTRAMUSCULAR | Status: AC
  Administered 2020-04-10: 10 mL via INTRADERMAL
  Filled 2020-04-10: qty 20

## 2020-04-10 MED ORDER — HYDROCODONE-ACETAMINOPHEN 5-325 MG PO TABS
1.0000 | ORAL_TABLET | Freq: Once | ORAL | Status: AC
  Administered 2020-04-10: 1 via ORAL
  Filled 2020-04-10: qty 1

## 2020-04-10 MED ORDER — VANCOMYCIN HCL 1500 MG/300ML IV SOLN
1500.0000 mg | Freq: Once | INTRAVENOUS | Status: AC
  Administered 2020-04-10: 1500 mg via INTRAVENOUS
  Filled 2020-04-10 (×2): qty 300

## 2020-04-10 MED ORDER — SULFAMETHOXAZOLE-TRIMETHOPRIM 800-160 MG PO TABS: 1.0000 | ORAL_TABLET | Freq: Two times a day (BID) | ORAL | 0 refills | Status: AC

## 2020-04-10 NOTE — ED Notes (Signed)
Makeya from lab called and requested an order for a second set of blood cultures for this pt. Notifed EDP

## 2020-04-10 NOTE — ED Notes (Signed)
Patient verbalizes understanding of discharge instructions. Opportunity for questioning and answers were provided. Armband removed by staff, pt discharged from ED.  

## 2020-04-10 NOTE — Discharge Instructions (Signed)
Thank you for allowing me to care for you today. Please return to the emergency department if you have new or worsening symptoms. Take your medications as instructed.  ° °

## 2020-04-10 NOTE — ED Triage Notes (Signed)
Patient reports chronic low back pain radiating to left buttocks/left leg for several months unrelieved by OTC pain medications , denies recent fall or injury/ambulatory . Patient added worsening swelling/redness at right elbow onset this week with no drainage /denies fever or chills.

## 2020-04-10 NOTE — ED Provider Notes (Signed)
[facility] EMERGENCY DEPARTMENT Provider Note   CSN: [id number] Arrival date & time: 04/10/20  0346     History Chief Complaint  Patient presents with  . Back Pain  . Cellulitis    Right Elbow    Luke Ellis is a 39 y.o. male.  Patient is a 39 year old male who has a history of IV drug use presenting to the emergency department for 2 separate issues.  He reports his first issue is that he has lower back pain which radiates to the left leg.  This has been going on for several months.  Denies known injury or trauma.  He has not had this seen in the past.  He reports over-the-counter medication is not working.  He reports that he does not have any leg weakness, saddle anesthesia, loss of control of bowel or bladder.  The second issue is that he has had an abscess on his right elbow for the past 3 months.  He denies injecting any drugs for the last 1 year.  He denies any fever, chills.  Reports pain and swelling.        Past Medical History:  Diagnosis Date  . Cellulitis   . IV drug user   . Tobacco use     Patient Active Problem List   Diagnosis Date Noted  . Cellulitis of left hand 01/26/2017  . Cellulitis 01/26/2017  . Tobacco use   . IV drug user     Past Surgical History:  Procedure Laterality Date  . SHOULDER SURGERY Left        No family history on file.  Social History   Tobacco Use  . Smoking status: Current Some Day Smoker    Packs/day: 1.00    Types: Cigarettes  . Smokeless tobacco: Former Network engineer Use Topics  . Alcohol use: No  . Drug use: Yes    Types: IV    Home Medications Prior to Admission medications   Medication Sig Start Date End Date Taking? Authorizing Provider  lidocaine (LIDODERM) 5 % Place 1 patch onto the skin daily. Remove & Discard patch within 12 hours or as directed by MD 04/02/20  Yes Henderly, Britni A, PA-C  naproxen (NAPROSYN) 500 MG tablet Take 1 tablet (500 mg total) by mouth 2 (two) times  daily. 04/02/20  Yes Henderly, Britni A, PA-C  sulfamethoxazole-trimethoprim (BACTRIM DS) 800-160 MG tablet Take 1 tablet by mouth 2 (two) times daily for 7 days. 04/10/20 04/17/20  Alveria Apley, PA-C    Allergies    Patient has no known allergies.  Review of Systems   Review of Systems  Constitutional: Negative for chills and fever.  Respiratory: Negative for shortness of breath.   Gastrointestinal: Negative for abdominal pain and vomiting.  Genitourinary: Negative for difficulty urinating and frequency.  Musculoskeletal: Positive for back pain. Negative for gait problem.  Skin: Positive for wound.  Neurological: Negative for weakness and numbness.  Hematological: Does not bruise/bleed easily.  All other systems reviewed and are negative.   Physical Exam Updated Vital Signs BP 116/75   Pulse 77   Temp 98 F (36.7 C) (Oral)   Resp 13   Ht 6\' 1"  (1.854 m)   Wt 85 kg   SpO2 95%   BMI 24.72 kg/m   Physical Exam Vitals and nursing note reviewed.  Constitutional:      Appearance: Normal appearance.  HENT:     Head: Normocephalic.     Mouth/Throat:  Mouth: Mucous membranes are moist.  Eyes:     Conjunctiva/sclera: Conjunctivae normal.  Pulmonary:     Effort: Pulmonary effort is normal.     Breath sounds: Normal breath sounds.  Musculoskeletal:     Comments: Medial right elbow with 4cm fluctuant abscess with significant surrounding erythema  Skin:    General: Skin is dry.  Neurological:     Mental Status: He is alert.     Sensory: No sensory deficit.     Motor: No weakness.     Gait: Gait normal.  Psychiatric:        Mood and Affect: Mood normal.     ED Results / Procedures / Treatments   Labs (all labs ordered are listed, but only abnormal results are displayed) Labs Reviewed  COMPREHENSIVE METABOLIC PANEL - Abnormal; Notable for the following components:      Result Value   Chloride 97 (*)    Glucose, Bld 115 (*)    All other components within normal  limits  CBC WITH DIFFERENTIAL/PLATELET - Abnormal; Notable for the following components:   Hemoglobin 12.9 (*)    HCT 38.2 (*)    All other components within normal limits  CULTURE, BLOOD (ROUTINE X 2)  CULTURE, BLOOD (ROUTINE X 2)  CBC WITH DIFFERENTIAL/PLATELET    EKG None  Radiology DG Lumbar Spine Complete  Result Date: 04/10/2020 CLINICAL DATA:  Chronic low back pain EXAM: LUMBAR SPINE - COMPLETE 4+ VIEW COMPARISON:  None. FINDINGS: No evidence of fracture or erosion. Mild disc narrowing and trace endplate spurring at L3-4 and L4-5. IMPRESSION: No acute or focal finding. Electronically Signed   By: Marnee Spring M.D.   On: 04/10/2020 04:56   DG Elbow Complete Right  Result Date: 04/10/2020 CLINICAL DATA:  Right elbow redness and swelling EXAM: RIGHT ELBOW - COMPLETE 3+ VIEW COMPARISON:  None. FINDINGS: Focal soft tissue swelling with regional subcutaneous stranding at the medial elbow. No underlying erosion, soft tissue calcification, or joint effusion IMPRESSION: Soft tissue swelling without joint effusion or osseous abnormality. Electronically Signed   By: Marnee Spring M.D.   On: 04/10/2020 04:55    Procedures .Marland KitchenIncision and Drainage  Date/Time: 04/10/2020 8:11 AM Performed by: Arlyn Dunning, PA-C Authorized by: Arlyn Dunning, PA-C   Consent:    Consent obtained:  Verbal   Consent given by:  Patient   Risks discussed:  Bleeding, incomplete drainage and pain   Alternatives discussed:  Alternative treatment Location:    Type:  Abscess   Size:  4cm   Location:  Upper extremity   Upper extremity location:  Elbow   Elbow location:  R elbow Pre-procedure details:    Skin preparation:  Betadine Anesthesia (see MAR for exact dosages):    Anesthesia method:  Local infiltration   Local anesthetic:  Lidocaine 2% WITH epi Procedure type:    Complexity:  Complex Procedure details:    Incision types:  Single straight   Incision depth:  Dermal   Scalpel blade:  11    Wound management:  Probed and deloculated and irrigated with saline   Drainage:  Bloody and purulent   Drainage amount:  Copious   Wound treatment:  Wound left open   Packing materials:  None Post-procedure details:    Patient tolerance of procedure:  Tolerated well, no immediate complications   (including critical care time)  Medications Ordered in ED Medications  vancomycin (VANCOREADY) IVPB 1500 mg/300 mL (1,500 mg Intravenous New Bag/Given 04/10/20 0833)  lidocaine-EPINEPHrine (  XYLOCAINE W/EPI) 2 %-1:200000 (PF) injection 10 mL (10 mLs Intradermal Given 04/10/20 0730)  predniSONE (DELTASONE) tablet 60 mg (60 mg Oral Given 04/10/20 0737)  HYDROcodone-acetaminophen (NORCO/VICODIN) 5-325 MG per tablet 1 tablet (1 tablet Oral Given 04/10/20 0737)    ED Course  I have reviewed the triage vital signs and the nursing notes.  Pertinent labs & imaging results that were available during my care of the patient were reviewed by me and considered in my medical decision making (see chart for details).  Clinical Course as of Apr 10 941  Fri Apr 10, 2020  7425 Patient presenting with 2 separate issues.  The first is back pain.  He has had this for some time.  He does not have any leg weakness, saddle anesthesia, loss of control of bowel or bladder movements or fever.  His second issue is abscess and cellulitis to the right elbow.  He has had x-rays of his L-spine and his elbow which show no concern for any acute or emergent processes.  His white blood cell count is normal.  He appears well and does not appear septic.  Given his history of IV drug use and his lack of primary medical doctor follow-up we will give him a dose of IV vancomycin.  I&D will be performed of his abscess and blood cultures will be taken.   [KM]  0810 Abscess drained, patient tolerated well. Patient given dose of IV abx. Patient would like to be d/c home. Will start bactrim. Advised to return in 2 days for recheck or sooner if new or  worsening symptoms /   [KM]    Clinical Course User Index [KM] Jeral Pinch   MDM Rules/Calculators/A&P                      Based on review of vitals, medical screening exam, lab work and/or imaging, there does not appear to be an acute, emergent etiology for the patient's symptoms. Counseled pt on good return precautions and encouraged both PCP and ED follow-up as needed.  Prior to discharge, I also discussed incidental imaging findings with patient in detail and advised appropriate, recommended follow-up in detail.  Clinical Impression: 1. Abscess   2. Cellulitis of right upper extremity   3. Chronic bilateral low back pain, unspecified whether sciatica present     Disposition: Discharge  Prior to providing a prescription for a controlled substance, I independently reviewed the patient's recent prescription history on the [place] Controlled Substance Reporting System. The patient had no recent or regular prescriptions and was deemed appropriate for a brief, less than 3 day prescription of narcotic for acute analgesia.  This note was prepared with assistance of Conservation officer, historic buildings. Occasional wrong-word or sound-a-like substitutions may have occurred due to the inherent limitations of voice recognition software.  Final Clinical Impression(s) / ED Diagnoses Final diagnoses:  Abscess  Cellulitis of right upper extremity  Chronic bilateral low back pain, unspecified whether sciatica present    Rx / DC Orders ED Discharge Orders         Ordered    sulfamethoxazole-trimethoprim (BACTRIM DS) 800-160 MG tablet  2 times daily     04/10/20 0813           Arlyn Dunning, PA-C 04/10/20 9563    Tegeler, Canary Brim, MD 04/10/20 1549

## 2020-04-15 LAB — CULTURE, BLOOD (ROUTINE X 2)
Culture: NO GROWTH
Culture: NO GROWTH
Special Requests: ADEQUATE

## 2020-05-19 ENCOUNTER — Emergency Department (HOSPITAL_COMMUNITY)
Admission: EM | Admit: 2020-05-19 | Discharge: 2020-05-19 | Disposition: A | Discharge status: Home / Self Care | Payer: Self-pay | Attending: Emergency Medicine | Admitting: Emergency Medicine

## 2020-05-19 ENCOUNTER — Other Ambulatory Visit: Payer: Self-pay

## 2020-05-19 ENCOUNTER — Encounter (HOSPITAL_COMMUNITY): Payer: Self-pay

## 2020-05-19 DIAGNOSIS — Z79899 Other long term (current) drug therapy: Secondary | ICD-10-CM | POA: Insufficient documentation

## 2020-05-19 DIAGNOSIS — M5442 Lumbago with sciatica, left side: Secondary | ICD-10-CM | POA: Insufficient documentation

## 2020-05-19 DIAGNOSIS — F17210 Nicotine dependence, cigarettes, uncomplicated: Secondary | ICD-10-CM | POA: Insufficient documentation

## 2020-05-19 DIAGNOSIS — M5432 Sciatica, left side: Secondary | ICD-10-CM

## 2020-05-19 MED ORDER — PREDNISONE 20 MG PO TABS: 40.0000 mg | ORAL_TABLET | Freq: Every day | ORAL | 0 refills | Status: AC

## 2020-05-19 MED ORDER — CYCLOBENZAPRINE HCL 10 MG PO TABS
5.0000 mg | ORAL_TABLET | Freq: Once | ORAL | Status: AC
  Administered 2020-05-19: 5 mg via ORAL
  Filled 2020-05-19: qty 1

## 2020-05-19 MED ORDER — CYCLOBENZAPRINE HCL 10 MG PO TABS
10.0000 mg | ORAL_TABLET | Freq: Two times a day (BID) | ORAL | 0 refills | Status: AC | PRN
Start: 2020-05-19 — End: 2020-05-24

## 2020-05-19 MED ORDER — KETOROLAC TROMETHAMINE 15 MG/ML IJ SOLN
15.0000 mg | Freq: Once | INTRAMUSCULAR | Status: AC
  Administered 2020-05-19: 15 mg via INTRAMUSCULAR
  Filled 2020-05-19: qty 1

## 2020-05-19 NOTE — Discharge Instructions (Addendum)
I have prescribed a short course of muscle relaxers to help with your symptoms, this should help with the pain along your left buttocks.  I have also prescribed a short course of steroids, please take 2 tablets daily for the next 5 days.  Be aware this medication can cause flushness, appetite changes, insomnia.  The number to the [facility] health and wellness clinic is attached to your chart, please schedule an appointment order to establish primary care physician.

## 2020-05-19 NOTE — ED Provider Notes (Signed)
[facility] EMERGENCY DEPARTMENT Provider Note   CSN: [id number] Arrival date & time: 05/19/20  1531     History Chief Complaint  Patient presents with  . Back Pain    Luke Ellis is a 39 y.o. male.  39 y.o male with a PMH of cellulitis, IV drug use presents to the ED with a chief complaint of lower back pain for several months.  Patient was evaluated in the ED approximately 2 months ago.  Reports pain improved however it has now returned.  He describes a sharp sensation to the left lumbar spine with radiation to his left butt cheek, left knee along with left ankle.  The pain is exacerbated with sitting.  Has been taking ibuprofen daily to help with symptoms without improvement.  Has not used any IV drugs in over a year, reports no midline tenderness at this time.  No fever, saddle anesthesia, leg numbness, bowel or bladder complaints.  The history is provided by the patient.       Past Medical History:  Diagnosis Date  . Cellulitis   . IV drug user   . Tobacco use     Patient Active Problem List   Diagnosis Date Noted  . Cellulitis of left hand 01/26/2017  . Cellulitis 01/26/2017  . Tobacco use   . IV drug user     Past Surgical History:  Procedure Laterality Date  . SHOULDER SURGERY Left        No family history on file.  Social History   Tobacco Use  . Smoking status: Current Some Day Smoker    Packs/day: 1.00    Types: Cigarettes  . Smokeless tobacco: Former Engineer, water Use Topics  . Alcohol use: No  . Drug use: Yes    Types: IV    Home Medications Prior to Admission medications   Medication Sig Start Date End Date Taking? Authorizing Provider  cyclobenzaprine (FLEXERIL) 10 MG tablet Take 1 tablet (10 mg total) by mouth 2 (two) times daily as needed for up to 5 days for muscle spasms. 05/19/20 05/24/20  Claude Manges, PA-C  lidocaine (LIDODERM) 5 % Place 1 patch onto the skin daily. Remove & Discard patch within 12 hours or as  directed by MD 04/02/20   Henderly, Britni A, PA-C  naproxen (NAPROSYN) 500 MG tablet Take 1 tablet (500 mg total) by mouth 2 (two) times daily. 04/02/20   Henderly, Britni A, PA-C  predniSONE (DELTASONE) 20 MG tablet Take 2 tablets (40 mg total) by mouth daily for 5 days. 05/19/20 05/24/20  Claude Manges, PA-C    Allergies    Patient has no known allergies.  Review of Systems   Review of Systems  Constitutional: Negative for fever.  Musculoskeletal: Positive for back pain and myalgias.    Physical Exam Updated Vital Signs BP 124/73   Pulse 90   Temp 98 F (36.7 C) (Oral)   Resp 16   Ht 6' (1.829 m)   Wt 77.1 kg   SpO2 100%   BMI 23.06 kg/m   Physical Exam Vitals and nursing note reviewed.  Constitutional:      Appearance: Normal appearance.  HENT:     Head: Normocephalic and atraumatic.     Mouth/Throat:     Mouth: Mucous membranes are moist.  Cardiovascular:     Rate and Rhythm: Normal rate.  Pulmonary:     Effort: Pulmonary effort is normal.  Abdominal:     General: Abdomen is flat.  Musculoskeletal:     Cervical back: Normal range of motion and neck supple.       Legs:     Comments: ttp along the left gluteus, no midline tenderness.  RLE- KF,KE 5/5 strength LLE- HF, HE 5/5 strength Normal gait. No pronator drift. No leg drop.   CN I, II and VIII not tested. CN II-XII grossly intact bilaterally.     Skin:    General: Skin is warm and dry.  Neurological:     Mental Status: He is alert and oriented to person, place, and time.     ED Results / Procedures / Treatments   Labs (all labs ordered are listed, but only abnormal results are displayed) Labs Reviewed - No data to display  EKG None  Radiology No results found.  Procedures Procedures (including critical care time)  Medications Ordered in ED Medications  ketorolac (TORADOL) 15 MG/ML injection 15 mg (has no administration in time range)  cyclobenzaprine (FLEXERIL) tablet 5 mg (has no  administration in time range)    ED Course  I have reviewed the triage vital signs and the nursing notes.  Pertinent labs & imaging results that were available during my care of the patient were reviewed by me and considered in my medical decision making (see chart for details).    MDM Rules/Calculators/A&P  Patient with a PMH of IV drugb use (no injectables in a year) presents to the ED with a chief complaint of recurrent back pain.  Endorses sharp pain along the left lower lumbar spine with radiation down to his buttocks, down his leg.  Previously seen in the ED for the same complaint.  We discussed red flags such as fever, saddle anesthesia, numbness, leg weakness, urinary incontinence, bowel complaints, he denies all of these at length.  During my evaluation patient is hemodynamically stable, he is afebrile, there is no midline tenderness, no erythema, no pain with palpation of the vertebrae in the lumbar spine.  Pain is along the left buttocks distribution along with left lower back.  Pelvis is stable.  He is ambulatory with antalgic gait.  According to prior providers note after chart review, I see that patient had x-ray in the past, this was without any abnormalities.  Patient reports pain is similar in nature as last time and is exacerbated with sitting, he does manual labor for work.  Exam is reassuring, he is nontoxic-appearing.  We discussed treatments of muscle relaxers along with steroids while at home.  No prior history of diabetes has been recorded.  Vitals are within normal limits.  Patient is stable for discharge.   Portions of this note were generated with Scientist, clinical (histocompatibility and immunogenetics). Dictation errors may occur despite best attempts at proofreading.  Final Clinical Impression(s) / ED Diagnoses Final diagnoses:  Sciatica of left side    Rx / DC Orders ED Discharge Orders         Ordered    cyclobenzaprine (FLEXERIL) 10 MG tablet  2 times daily PRN     Discontinue  Reprint       05/19/20 1711    predniSONE (DELTASONE) 20 MG tablet  Daily     Discontinue  Reprint     05/19/20 1711           Claude Manges, PA-C 05/19/20 1714    Pricilla Loveless, MD 05/19/20 2322

## 2020-05-19 NOTE — ED Triage Notes (Signed)
Lower back pain that radiates into buttocks and down leg x "months". Pt reports he has been seen for same here several time with no improvement. Has not followed up d/t not having insurance.

## 2020-05-19 NOTE — ED Notes (Signed)
Pt d/c home per MD order. Discharge summary reviewed with pt, pt verbalizes understanding.. No s/s of acute distress noted, ambulatory off unit.  

## 2020-11-10 IMAGING — DX DG LUMBAR SPINE COMPLETE 4+V
5 series · 5 of 5 positions shown · non-contrast
Comparison: None.

CLINICAL DATA: Chronic low back pain

EXAM:
LUMBAR SPINE - COMPLETE 4+ VIEW

[l-spine ap]
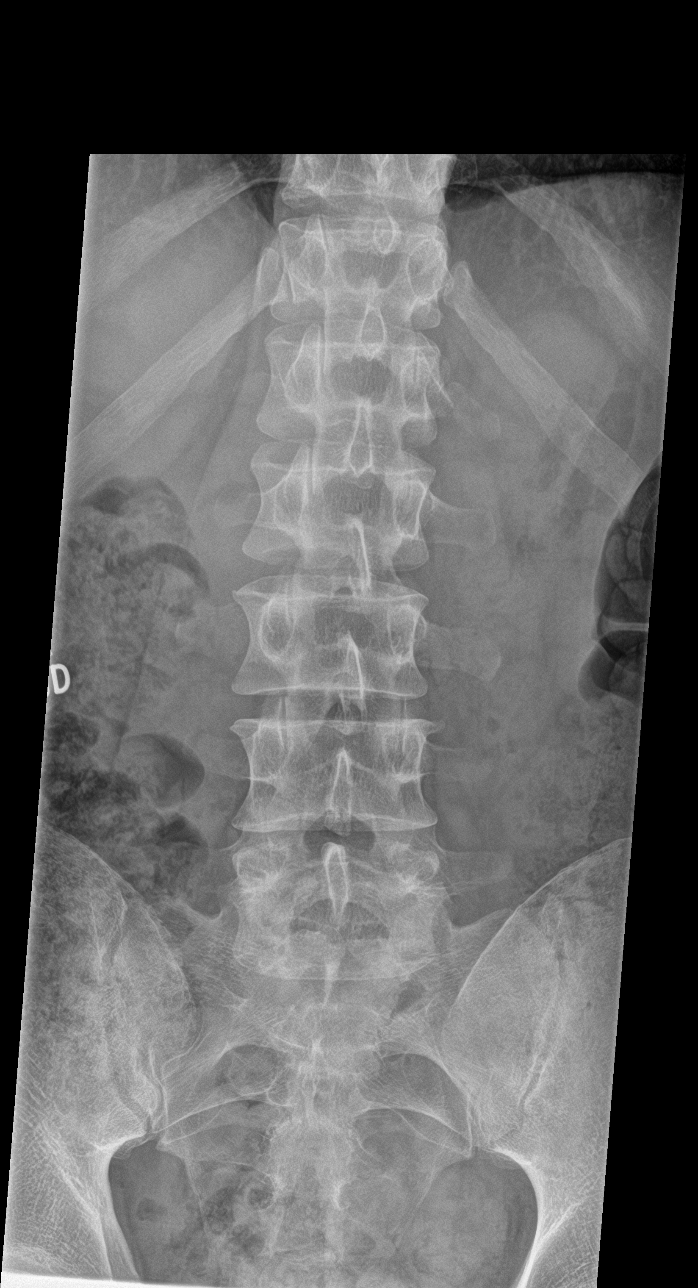

[l-spine obl (1 of 2)]
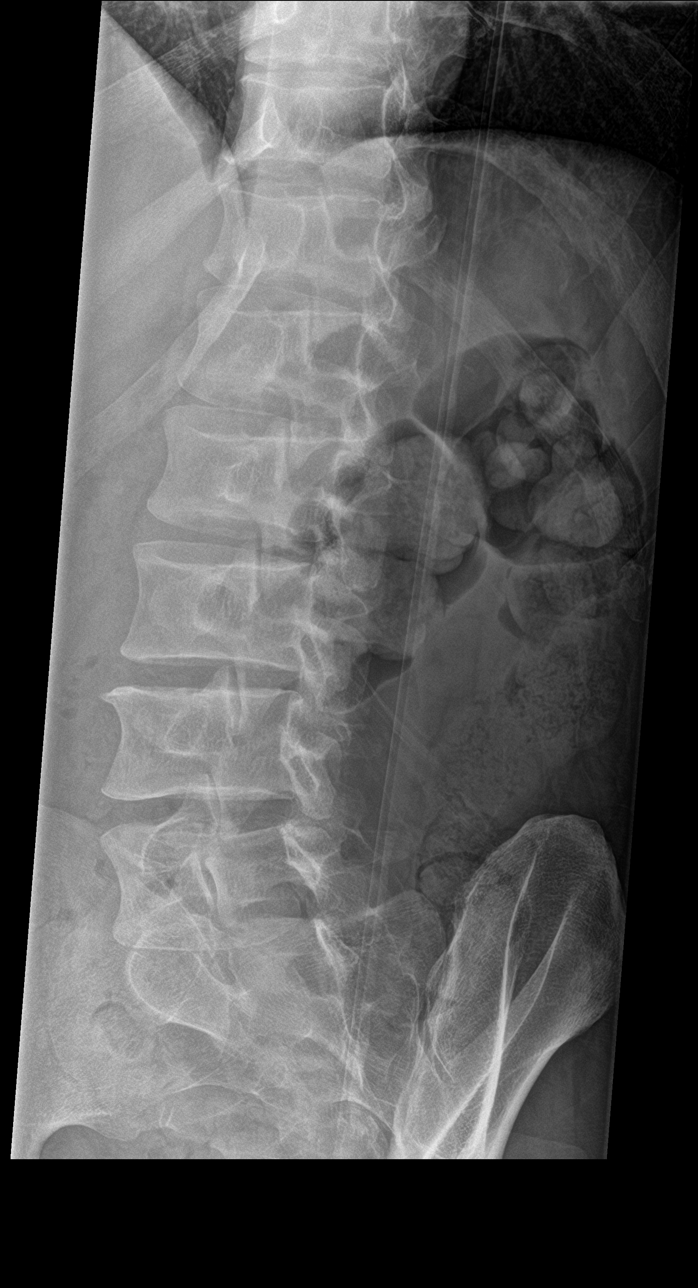

[l-spine obl (2 of 2)]
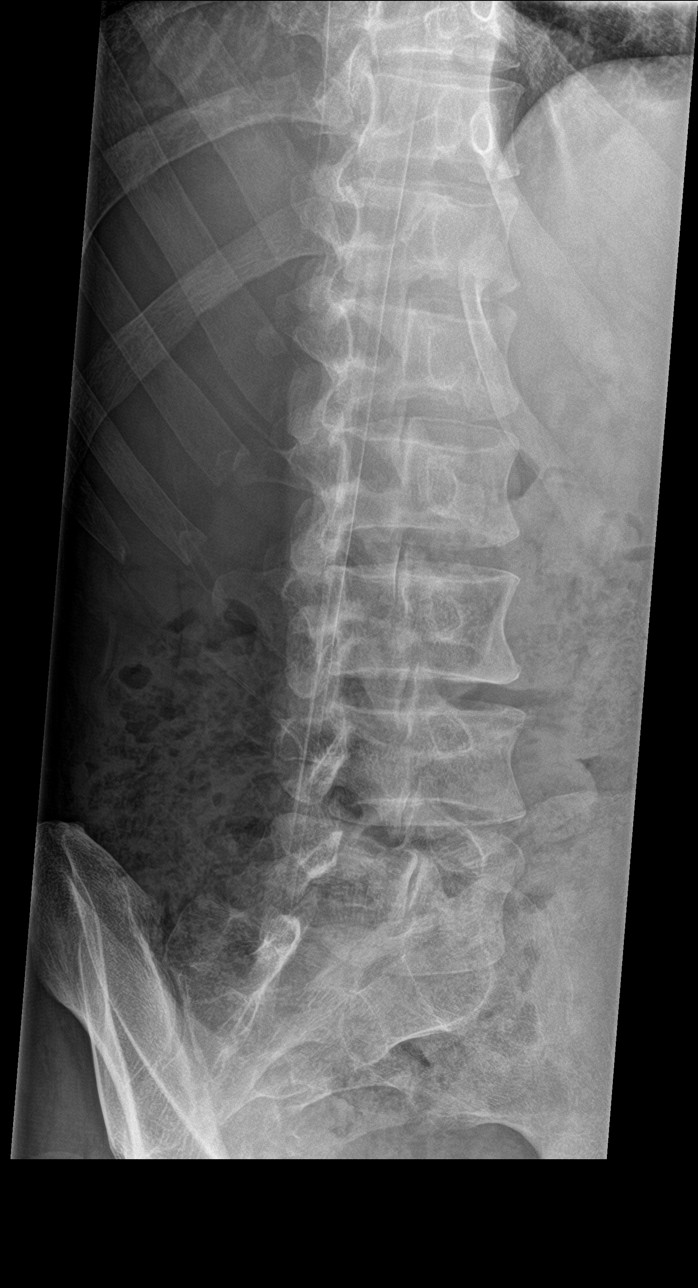

[l-spine lat]
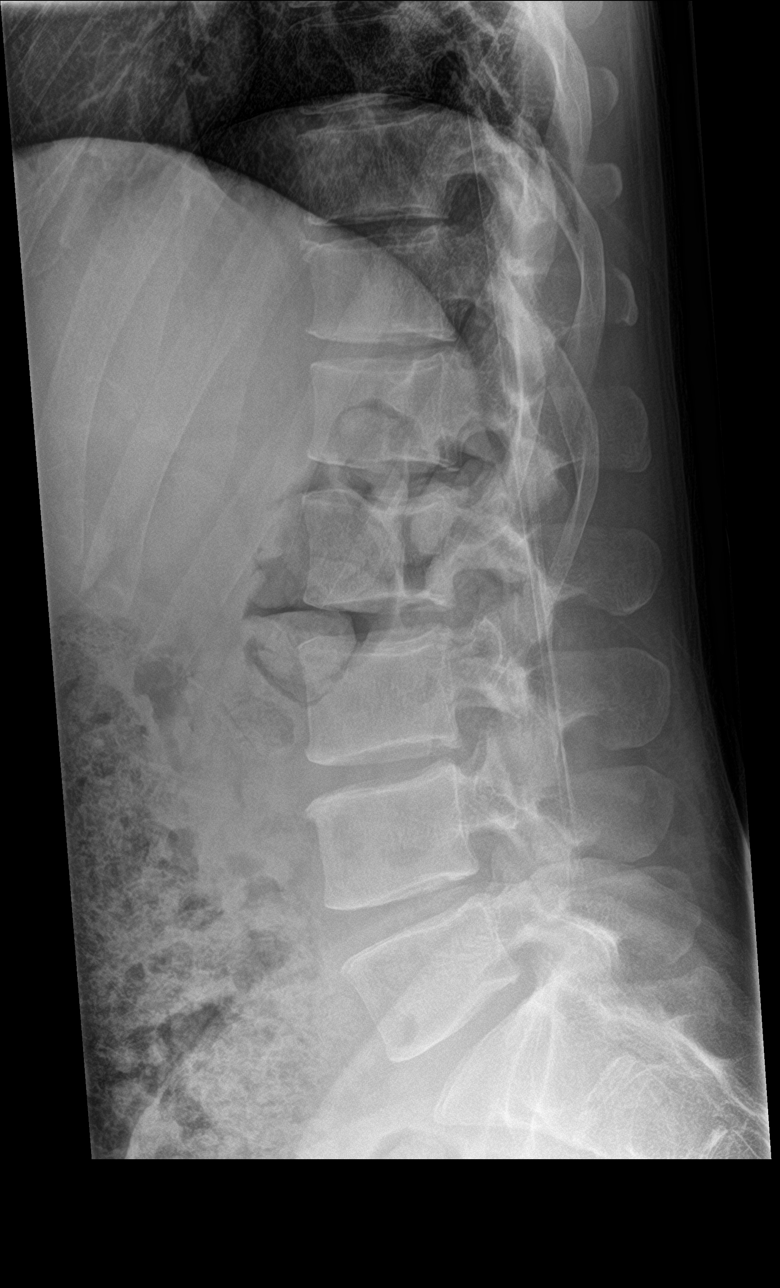

[l-spine spot]
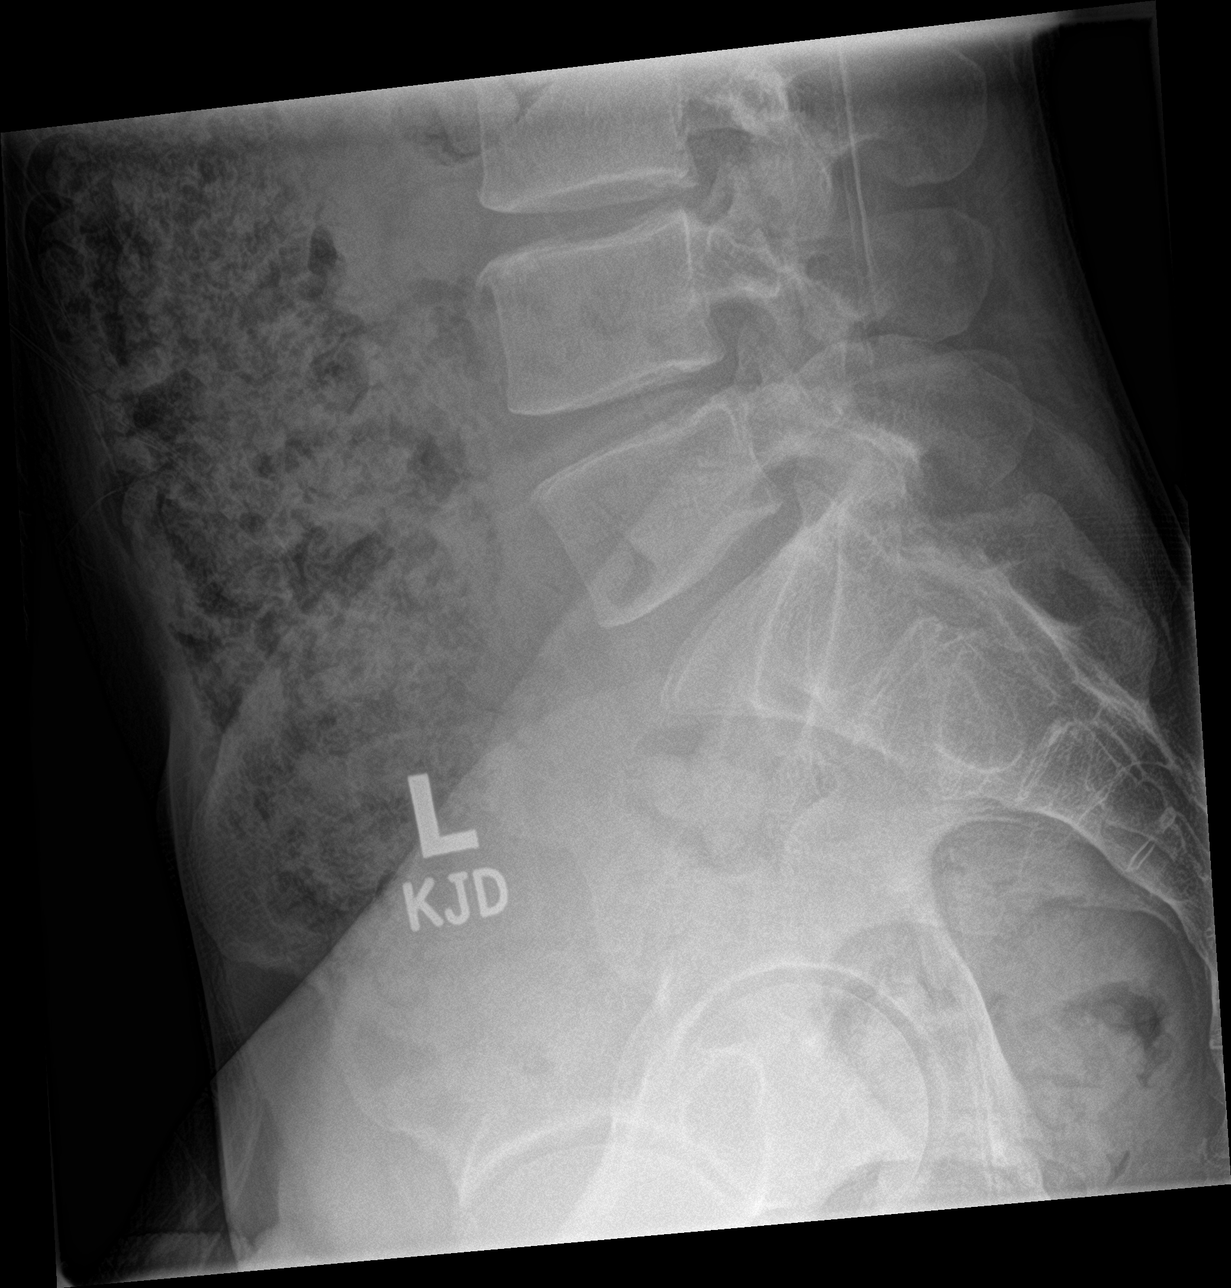

[5 of 5 positions shown; findings below may reference images not displayed]

FINDINGS: No evidence of fracture or erosion. Mild disc narrowing and trace
endplate spurring at L3-4 and L4-5.
IMPRESSION: No acute or focal finding.

## 2021-02-10 ENCOUNTER — Ambulatory Visit: Payer: Self-pay | Admitting: Nurse Practitioner

## 2024-06-05 ENCOUNTER — Encounter (HOSPITAL_BASED_OUTPATIENT_CLINIC_OR_DEPARTMENT_OTHER): Payer: Self-pay | Admitting: Student

## 2024-06-05 ENCOUNTER — Ambulatory Visit (INDEPENDENT_AMBULATORY_CARE_PROVIDER_SITE_OTHER): Admitting: Student

## 2024-06-05 VITALS — BP 108/79 | HR 95 | Temp 98.2°F | Resp 16 | Ht 71.26 in | Wt 168.2 lb

## 2024-06-05 DIAGNOSIS — S80262A Insect bite (nonvenomous), left knee, initial encounter: Secondary | ICD-10-CM | POA: Insufficient documentation

## 2024-06-05 DIAGNOSIS — Z131 Encounter for screening for diabetes mellitus: Secondary | ICD-10-CM

## 2024-06-05 DIAGNOSIS — F17210 Nicotine dependence, cigarettes, uncomplicated: Secondary | ICD-10-CM | POA: Diagnosis not present

## 2024-06-05 DIAGNOSIS — W57XXXA Bitten or stung by nonvenomous insect and other nonvenomous arthropods, initial encounter: Secondary | ICD-10-CM

## 2024-06-05 DIAGNOSIS — F1110 Opioid abuse, uncomplicated: Secondary | ICD-10-CM | POA: Insufficient documentation

## 2024-06-05 DIAGNOSIS — G8929 Other chronic pain: Secondary | ICD-10-CM | POA: Insufficient documentation

## 2024-06-05 DIAGNOSIS — Z13220 Encounter for screening for lipoid disorders: Secondary | ICD-10-CM

## 2024-06-05 DIAGNOSIS — M5441 Lumbago with sciatica, right side: Secondary | ICD-10-CM

## 2024-06-05 DIAGNOSIS — Z1159 Encounter for screening for other viral diseases: Secondary | ICD-10-CM

## 2024-06-05 DIAGNOSIS — Z23 Encounter for immunization: Secondary | ICD-10-CM

## 2024-06-05 DIAGNOSIS — Z7689 Persons encountering health services in other specified circumstances: Secondary | ICD-10-CM

## 2024-06-05 DIAGNOSIS — Z5941 Food insecurity: Secondary | ICD-10-CM

## 2024-06-05 MED ORDER — METHYLPREDNISOLONE 4 MG PO TBPK: ORAL_TABLET | ORAL | 0 refills | Status: AC

## 2024-06-05 NOTE — Progress Notes (Signed)
 New Patient Office Visit  Subjective    Patient ID: Luke Ellis, male    DOB: [birth year]  Age: 43 y.o. MRN: [id number]  CC:  Chief Complaint  Patient presents with   Back pain    Having back pain. Taking ibuprofen 1000mg  TID for 1 week now. Went to [facility] 1 year ago, ago pulled on neck, arms, or feet on bed. Cannot sit well. Needs to stand or lay. GF noticed he had some swelling on right side of back/ hip. Has pain that radiates down to right leg. Has 1 daughter, about to be three years old and he cannot pick her up. Was on suboxone, but relapsed 6 months ago with back pain. Needs to see a doctor about this.    Establish Care    Here to establish care. Has been told sugars were high in jail.    Nicotine  Dependence    Would like to get some nicotine  patches.    Discussed the use of AI scribe software for clinical note transcription with the patient, who gave verbal consent to proceed.  History of Present Illness   Luke Ellis is a 43 year old male who presents for establishment of care with chronic low back pain and concerns about potential diabetes.  He has been experiencing chronic low back pain for approximately six months. The pain is severe, radiates from the lower back into the buttock, down the leg, and into the ankle. He describes the pain as 'electric' and notes difficulty sitting or standing for prolonged periods. He has not received a steroid dose pack previously and is currently using ibuprofen for pain management.  He is concerned about possibly being in the early stages of diabetes, noting a history of high blood sugar levels while incarcerated, with a reported glucose level of 600 mg/dL. He experiences symptoms such as excessive thirst, frequent urination, and significant fatigue, especially after consuming sweets. He has not had an A1c test before.  He has a history of hepatitis C, which was reportedly resolved without treatment after being diagnosed in 2017. He has not  had further testing since then.  He has a history of substance use, including heroin, meth, cocaine, and fentanyl, with the last IV drug use occurring in 2020. He relapsed on fentanyl about six months ago but is not using it intravenously. He previously received treatment with Suboxone and wants to resume treatment.  He reports frequent tick bites, with the most recent occurring last week on the left leg behind the knee. He is concerned about Lyme disease due to his fiance's diagnosis and his frequent exposure to ticks.  He smokes approximately one-third of a pack of cigarettes per day and wants to quit, having previously used nicotine  patches.      Screenings:  Colon Cancer: does not apply Lung Cancer: 1/3 ppd. Diabetes: indicated HLD: Indicated   Outpatient Encounter Medications as of 06/05/2024  Medication Sig   ibuprofen (ADVIL) 200 MG tablet Take 1,000 mg by mouth 3 (three) times daily.   methylPREDNISolone  (MEDROL  DOSEPAK) 4 MG TBPK tablet Use as directed.   [DISCONTINUED] lidocaine  (LIDODERM ) 5 % Place 1 patch onto the skin daily. Remove & Discard patch within 12 hours or as directed by MD   [DISCONTINUED] naproxen  (NAPROSYN ) 500 MG tablet Take 1 tablet (500 mg total) by mouth 2 (two) times daily.   No facility-administered encounter medications on file as of 06/05/2024.    Past Medical History:  Diagnosis Date   Cellulitis  IV drug user    IV drug user    Tobacco use     Past Surgical History:  Procedure Laterality Date   SHOULDER SURGERY Left     Family History  Problem Relation Age of Onset   Ellis disease Mother        stage 3 Ellis disease   Healthy Father    Heart disease Sister        endocarditis, due to IV user    Social History   Socioeconomic History   Marital status: Single    Spouse name: Not on file   Number of children: 1   Years of education: Not on file   Highest education level: Not on file  Occupational History   Not on file   Tobacco Use   Smoking status: Some Days    Current packs/day: 0.25    Average packs/day: 0.3 packs/day for 28.6 years (7.1 ttl pk-yrs)    Types: Cigarettes    Start date: 1997    Passive exposure: Current   Smokeless tobacco: Former  Building services engineer status: Never Used  Substance and Sexual Activity   Alcohol use: No   Drug use: Not Currently    Types: IV    Comment: quit May 29, 2019, sister died from IV use - endocarditis   Sexual activity: Not on file  Other Topics Concern   Not on file  Social History Narrative   Not on file   Social Drivers of Health   Financial Resource Strain: Not on file  Food Insecurity: Food Insecurity Present (06/05/2024)   Hunger Vital Sign    Worried About Running Out of Food in the Last Year: Sometimes true    Ran Out of Food in the Last Year: Sometimes true  Transportation Needs: No Transportation Needs (06/05/2024)   PRAPARE - Administrator, Civil Service (Medical): No    Lack of Transportation (Non-Medical): No  Physical Activity: Not on file  Stress: Not on file  Social Connections: Not on file  Intimate Partner Violence: Not At Risk (06/05/2024)   Humiliation, Afraid, Rape, and Kick questionnaire    Fear of Current or Ex-Partner: No    Emotionally Abused: No    Physically Abused: No    Sexually Abused: No    ROS  Per HPI      Objective    BP 108/79   Pulse 95   Temp 98.2 F (36.8 C) (Oral)   Resp 16   Ht 5' 11.26 (1.81 m)   Wt 168 lb 3.2 oz (76.3 kg)   SpO2 96%   BMI 23.29 kg/m   Physical Exam Constitutional:      General: He is not in acute distress.    Appearance: Normal appearance. He is not ill-appearing.  HENT:     Head: Normocephalic and atraumatic.     Right Ear: External ear normal.     Left Ear: External ear normal.     Nose: Nose normal.     Mouth/Throat:     Mouth: Mucous membranes are moist.     Pharynx: Oropharynx is clear.  Eyes:     General: No scleral icterus.    Extraocular  Movements: Extraocular movements intact.     Conjunctiva/sclera: Conjunctivae normal.     Pupils: Pupils are equal, round, and reactive to light.  Neck:     Vascular: No carotid bruit.  Cardiovascular:     Rate and Rhythm: Normal rate and regular rhythm.  Pulses: Normal pulses.     Heart sounds: Normal heart sounds. No murmur heard.    No friction rub.  Pulmonary:     Effort: Pulmonary effort is normal. No respiratory distress.     Breath sounds: Normal breath sounds. No wheezing, rhonchi or rales.  Musculoskeletal:        General: Normal range of motion.     Cervical back: Neck supple.     Right lower leg: No edema.     Left lower leg: No edema.     Comments: Back Exam:  Inspection: Unremarkable  Motion: Flexion 45 deg, Extension 45 deg, Side Bending to 45 deg bilaterally,  Rotation to 45 deg bilaterally. Pain to right sided rotation.  SLR laying: positive  Palpable tenderness: Positive to Lumbar R paraspinous msuculature and around SI joint. Sensory change: Gross sensation intact. Gait unremarkable.    Skin:    General: Skin is warm and dry.     Coloration: Skin is not jaundiced or pale.  Neurological:     General: No focal deficit present.     Mental Status: He is alert.  Psychiatric:        Mood and Affect: Mood normal.        Behavior: Behavior normal.     Last CBC Lab Results  Component Value Date   WBC 10.3 04/10/2020   HGB 12.9 (L) 04/10/2020   HCT 38.2 (L) 04/10/2020   MCV 90.1 04/10/2020   MCH 30.4 04/10/2020   RDW 11.9 04/10/2020   PLT 240 04/10/2020   Last metabolic panel Lab Results  Component Value Date   GLUCOSE 115 (H) 04/10/2020   NA 135 04/10/2020   K 4.9 04/10/2020   CL 97 (L) 04/10/2020   CO2 26 04/10/2020   BUN 17 04/10/2020   CREATININE 0.90 04/10/2020   GFRNONAA >60 04/10/2020   CALCIUM 9.3 04/10/2020   PROT 7.4 04/10/2020   ALBUMIN 3.7 04/10/2020   BILITOT 0.5 04/10/2020   ALKPHOS 61 04/10/2020   AST 20 04/10/2020   ALT 15  04/10/2020   ANIONGAP 12 04/10/2020   Last lipids No results found for: CHOL, HDL, LDLCALC, LDLDIRECT, TRIG, CHOLHDL Last hemoglobin A1c No results found for: HGBA1C      Assessment & Plan:   Encounter to establish care  Tick bite of left knee, initial encounter Assessment & Plan: Recent tick bite on left posterior knee with no current rash or symptoms suggestive of Lyme disease. Frequently exposed to ticks. - Order Lyme titer to rule out Lyme disease   Orders: -     Lyme Disease Serology w/Reflex  Chronic right-sided low back pain with right-sided sciatica Assessment & Plan: Chronic low back pain with right-sided sciatica for six months. Pain radiates from the buttock down the leg to the ankle, suggesting nerve involvement. Pain is severe, electric in nature, and affects daily activities. Differential includes muscle strain and SI joint pain. Positive straight leg raise test indicates sciatica. - Prescribe Medrol  Dosepak for inflammation - Consider x-ray of the back - If Medrol  Dosepak is ineffective, prescribe meloxicam - Consider referral to orthopedic specialist if condition does not improve  Orders: -     CBC with Differential/Platelet -     Comprehensive metabolic panel with GFR -     methylPREDNISolone ; Use as directed.  Dispense: 21 each; Refill: 0  Cigarette nicotine  dependence without complication Assessment & Plan: Current smoker, approximately one-third pack per day. Expresses desire to quit smoking. - Provide information on  1-800-QUIT-NOW for free nicotine  patches and lozenges - Advise against smoking while using nicotine  patches   Opiate abuse, continuous (HCC) Assessment & Plan: Currently using fentanyl, with a history of relapse after previous treatment with Suboxone. Expresses desire to quit and acknowledges that fentanyl is not helping with pain. Previously treated at [facility] but was discharged due to relapse. - Refer to [facility] for  substance use disorder treatment   Screening for diabetes mellitus -     Hemoglobin A1c  Encounter for lipid screening for cardiovascular disease -     Lipid panel  Encounter for hepatitis C virus screening test for high risk patient -     HCV RNA quant rflx ultra or genotyp  Encounter for immunization -     Tdap vaccine greater than or equal to 7yo IM  Food insecurity -     AMB Referral [facility] Care Management    Return in about 6 weeks (around 07/17/2024) for low back pain.   Caoilainn Sacks T Symir Mah, PA-C

## 2024-06-05 NOTE — Assessment & Plan Note (Signed)
 Recent tick bite on left posterior knee with no current rash or symptoms suggestive of Lyme disease. Frequently exposed to ticks. - Order Lyme titer to rule out Lyme disease

## 2024-06-05 NOTE — Patient Instructions (Addendum)
 It was nice to see you today!  As we discussed in clinic:  1-800-QUIT-NOW They should provide about 2 months worth of nicotine  replacement therapy.  Please call [facility] for addiction help.  If you have any problems before your next visit feel free to message me via MyChart (minor issues or questions) or call the office, otherwise you may reach out to schedule an office visit.  Thank you! Ziyon Soltau, PA-C

## 2024-06-05 NOTE — Assessment & Plan Note (Signed)
 Currently using fentanyl, with a history of relapse after previous treatment with Suboxone. Expresses desire to quit and acknowledges that fentanyl is not helping with pain. Previously treated at [facility] but was discharged due to relapse. - Refer to [facility] for substance use disorder treatment

## 2024-06-05 NOTE — Assessment & Plan Note (Signed)
 Current smoker, approximately one-third pack per day. Expresses desire to quit smoking. - Provide information on 1-800-QUIT-NOW for free nicotine  patches and lozenges - Advise against smoking while using nicotine  patches

## 2024-06-05 NOTE — Assessment & Plan Note (Addendum)
 Chronic low back pain with right-sided sciatica for six months with current exacerbation. Pain radiates from the buttock down the leg to the ankle, suggesting nerve involvement. Pain is severe, electric in nature, and affects daily activities. Differential includes muscle strain and SI joint pain. Positive straight leg raise test indicates sciatica. - Prescribe Medrol  Dosepak for inflammation - Consider x-ray of the back - If Medrol  Dosepak is ineffective, prescribe meloxicam - Consider referral to orthopedic specialist if condition does not improve

## 2024-06-06 ENCOUNTER — Other Ambulatory Visit (HOSPITAL_BASED_OUTPATIENT_CLINIC_OR_DEPARTMENT_OTHER)

## 2024-06-06 NOTE — Addendum Note (Signed)
 Addended by: MIRIAM JOSEPH on: 06/06/2024 04:51 PM   Modules accepted: Orders

## 2024-07-17 ENCOUNTER — Ambulatory Visit (HOSPITAL_BASED_OUTPATIENT_CLINIC_OR_DEPARTMENT_OTHER): Admitting: Student
